# Patient Record
Sex: Male | Born: 1935 | Race: White | Hispanic: No | Marital: Married | Smoking: Never smoker
Health system: Southern US, Community
[De-identification: ages and names within clinical notes are randomized; demographics above are authoritative.]

## PROBLEM LIST (undated history)

## (undated) DIAGNOSIS — F0390 Unspecified dementia without behavioral disturbance: Secondary | ICD-10-CM

---

## 2017-07-24 DIAGNOSIS — I2510 Atherosclerotic heart disease of native coronary artery without angina pectoris: Secondary | ICD-10-CM | POA: Diagnosis not present

## 2017-07-24 DIAGNOSIS — Z79891 Long term (current) use of opiate analgesic: Secondary | ICD-10-CM | POA: Diagnosis not present

## 2017-07-24 DIAGNOSIS — M1611 Unilateral primary osteoarthritis, right hip: Secondary | ICD-10-CM | POA: Diagnosis not present

## 2017-07-24 DIAGNOSIS — Z0000 Encounter for general adult medical examination without abnormal findings: Secondary | ICD-10-CM | POA: Diagnosis not present

## 2017-07-26 DIAGNOSIS — Z0000 Encounter for general adult medical examination without abnormal findings: Secondary | ICD-10-CM | POA: Diagnosis not present

## 2017-07-26 DIAGNOSIS — Z79891 Long term (current) use of opiate analgesic: Secondary | ICD-10-CM | POA: Diagnosis not present

## 2017-07-26 DIAGNOSIS — I2510 Atherosclerotic heart disease of native coronary artery without angina pectoris: Secondary | ICD-10-CM | POA: Diagnosis not present

## 2017-07-26 DIAGNOSIS — M1611 Unilateral primary osteoarthritis, right hip: Secondary | ICD-10-CM | POA: Diagnosis not present

## 2017-08-08 DIAGNOSIS — R739 Hyperglycemia, unspecified: Secondary | ICD-10-CM | POA: Diagnosis not present

## 2017-08-08 DIAGNOSIS — D72829 Elevated white blood cell count, unspecified: Secondary | ICD-10-CM | POA: Diagnosis not present

## 2017-08-31 DIAGNOSIS — M1611 Unilateral primary osteoarthritis, right hip: Secondary | ICD-10-CM | POA: Diagnosis not present

## 2017-08-31 DIAGNOSIS — G8929 Other chronic pain: Secondary | ICD-10-CM | POA: Diagnosis not present

## 2017-08-31 DIAGNOSIS — M25551 Pain in right hip: Secondary | ICD-10-CM | POA: Diagnosis not present

## 2017-09-12 DIAGNOSIS — R112 Nausea with vomiting, unspecified: Secondary | ICD-10-CM | POA: Diagnosis not present

## 2017-09-29 DIAGNOSIS — D649 Anemia, unspecified: Secondary | ICD-10-CM | POA: Diagnosis not present

## 2017-11-08 DIAGNOSIS — M1611 Unilateral primary osteoarthritis, right hip: Secondary | ICD-10-CM | POA: Diagnosis not present

## 2017-11-08 DIAGNOSIS — I2510 Atherosclerotic heart disease of native coronary artery without angina pectoris: Secondary | ICD-10-CM | POA: Diagnosis not present

## 2017-12-28 ENCOUNTER — Other Ambulatory Visit: Payer: Self-pay | Admitting: *Deleted

## 2017-12-28 NOTE — Patient Outreach (Signed)
[facility] Care Management  12/28/2017  Victor Jones 13-Aug-1928 [id number]   HTA/HRA No needs  RN spoke with pt and confirmed identifiers as caregiver spouse requested RN to talk with her concerning today's call. Pt granted permission to speak with his spouse. RN explained the purpose for the call today on behalf of HTA and review the questionnaire and inquired further on any needs. Wife indicated pt is doing well confirming the pt's primary in [place] and verified AD in place and the only issues is pt's memory. No other needs or issues to address at this time however grateful for the call. The only CAD history reported was history of a stent. Wife informed the pt would [facility] if any needs should arise. No other inquires or request at this time. Case close   Elliot CousinLisa Meilah Delrosario, RN Care Management Coordinator [facility] Main Office [phone]

## 2018-07-26 DIAGNOSIS — I2510 Atherosclerotic heart disease of native coronary artery without angina pectoris: Secondary | ICD-10-CM | POA: Diagnosis not present

## 2018-07-26 DIAGNOSIS — M1611 Unilateral primary osteoarthritis, right hip: Secondary | ICD-10-CM | POA: Diagnosis not present

## 2018-07-26 DIAGNOSIS — Z0000 Encounter for general adult medical examination without abnormal findings: Secondary | ICD-10-CM | POA: Diagnosis not present

## 2018-07-26 DIAGNOSIS — R7303 Prediabetes: Secondary | ICD-10-CM | POA: Diagnosis not present

## 2018-07-26 DIAGNOSIS — R7302 Impaired glucose tolerance (oral): Secondary | ICD-10-CM | POA: Diagnosis not present

## 2018-07-26 DIAGNOSIS — Z79891 Long term (current) use of opiate analgesic: Secondary | ICD-10-CM | POA: Diagnosis not present

## 2018-11-14 DIAGNOSIS — E782 Mixed hyperlipidemia: Secondary | ICD-10-CM | POA: Diagnosis not present

## 2018-11-14 DIAGNOSIS — I2510 Atherosclerotic heart disease of native coronary artery without angina pectoris: Secondary | ICD-10-CM | POA: Diagnosis not present

## 2019-02-08 DIAGNOSIS — Z23 Encounter for immunization: Secondary | ICD-10-CM | POA: Diagnosis not present

## 2019-04-15 DIAGNOSIS — B351 Tinea unguium: Secondary | ICD-10-CM | POA: Diagnosis not present

## 2019-04-15 DIAGNOSIS — M79674 Pain in right toe(s): Secondary | ICD-10-CM | POA: Diagnosis not present

## 2019-04-15 DIAGNOSIS — M79675 Pain in left toe(s): Secondary | ICD-10-CM | POA: Diagnosis not present

## 2019-05-28 DIAGNOSIS — R609 Edema, unspecified: Secondary | ICD-10-CM | POA: Diagnosis not present

## 2019-05-31 DIAGNOSIS — I2510 Atherosclerotic heart disease of native coronary artery without angina pectoris: Secondary | ICD-10-CM | POA: Diagnosis not present

## 2019-05-31 DIAGNOSIS — R609 Edema, unspecified: Secondary | ICD-10-CM | POA: Diagnosis not present

## 2019-05-31 DIAGNOSIS — E782 Mixed hyperlipidemia: Secondary | ICD-10-CM | POA: Diagnosis not present

## 2019-07-06 ENCOUNTER — Emergency Department: Payer: No Typology Code available for payment source

## 2019-07-06 ENCOUNTER — Other Ambulatory Visit: Payer: Self-pay

## 2019-07-06 ENCOUNTER — Inpatient Hospital Stay
Admission: EM | Admit: 2019-07-06 | Discharge: 2019-07-10 | DRG: 689 | Disposition: A | Discharge status: Home Health | Payer: No Typology Code available for payment source | Attending: Internal Medicine | Admitting: Internal Medicine

## 2019-07-06 DIAGNOSIS — G9341 Metabolic encephalopathy: Secondary | ICD-10-CM

## 2019-07-06 DIAGNOSIS — R001 Bradycardia, unspecified: Secondary | ICD-10-CM | POA: Diagnosis not present

## 2019-07-06 DIAGNOSIS — R0902 Hypoxemia: Secondary | ICD-10-CM | POA: Diagnosis not present

## 2019-07-06 DIAGNOSIS — G459 Transient cerebral ischemic attack, unspecified: Secondary | ICD-10-CM | POA: Diagnosis not present

## 2019-07-06 DIAGNOSIS — I6523 Occlusion and stenosis of bilateral carotid arteries: Secondary | ICD-10-CM | POA: Diagnosis not present

## 2019-07-06 DIAGNOSIS — I2510 Atherosclerotic heart disease of native coronary artery without angina pectoris: Secondary | ICD-10-CM | POA: Diagnosis present

## 2019-07-06 DIAGNOSIS — S3993XA Unspecified injury of pelvis, initial encounter: Secondary | ICD-10-CM | POA: Diagnosis not present

## 2019-07-06 DIAGNOSIS — F0391 Unspecified dementia with behavioral disturbance: Secondary | ICD-10-CM

## 2019-07-06 DIAGNOSIS — M25551 Pain in right hip: Secondary | ICD-10-CM | POA: Diagnosis not present

## 2019-07-06 DIAGNOSIS — R531 Weakness: Secondary | ICD-10-CM | POA: Diagnosis not present

## 2019-07-06 DIAGNOSIS — Z79899 Other long term (current) drug therapy: Secondary | ICD-10-CM

## 2019-07-06 DIAGNOSIS — R4020 Unspecified coma: Secondary | ICD-10-CM | POA: Diagnosis not present

## 2019-07-06 DIAGNOSIS — Y92009 Unspecified place in unspecified non-institutional (private) residence as the place of occurrence of the external cause: Secondary | ICD-10-CM

## 2019-07-06 DIAGNOSIS — Z9181 History of falling: Secondary | ICD-10-CM

## 2019-07-06 DIAGNOSIS — Z7982 Long term (current) use of aspirin: Secondary | ICD-10-CM

## 2019-07-06 DIAGNOSIS — F03918 Unspecified dementia, unspecified severity, with other behavioral disturbance: Secondary | ICD-10-CM

## 2019-07-06 DIAGNOSIS — W1830XA Fall on same level, unspecified, initial encounter: Secondary | ICD-10-CM | POA: Diagnosis present

## 2019-07-06 DIAGNOSIS — R338 Other retention of urine: Secondary | ICD-10-CM

## 2019-07-06 DIAGNOSIS — N179 Acute kidney failure, unspecified: Secondary | ICD-10-CM

## 2019-07-06 DIAGNOSIS — N320 Bladder-neck obstruction: Secondary | ICD-10-CM | POA: Diagnosis present

## 2019-07-06 DIAGNOSIS — Z96642 Presence of left artificial hip joint: Secondary | ICD-10-CM | POA: Diagnosis present

## 2019-07-06 DIAGNOSIS — N390 Urinary tract infection, site not specified: Principal | ICD-10-CM | POA: Diagnosis present

## 2019-07-06 DIAGNOSIS — Z955 Presence of coronary angioplasty implant and graft: Secondary | ICD-10-CM

## 2019-07-06 DIAGNOSIS — W19XXXA Unspecified fall, initial encounter: Secondary | ICD-10-CM

## 2019-07-06 DIAGNOSIS — E876 Hypokalemia: Secondary | ICD-10-CM | POA: Diagnosis not present

## 2019-07-06 DIAGNOSIS — Z20822 Contact with and (suspected) exposure to covid-19: Secondary | ICD-10-CM | POA: Diagnosis present

## 2019-07-06 LAB — URINALYSIS, COMPLETE (UACMP) WITH MICROSCOPIC
Bilirubin Urine: NEGATIVE
Glucose, UA: NEGATIVE mg/dL
Hgb urine dipstick: NEGATIVE
Ketones, ur: NEGATIVE mg/dL
Nitrite: NEGATIVE
Protein, ur: NEGATIVE mg/dL
Specific Gravity, Urine: 1.009 (ref 1.005–1.030)
pH: 6 (ref 5.0–8.0)

## 2019-07-06 LAB — COMPREHENSIVE METABOLIC PANEL
ALT: 24 U/L (ref 0–44)
AST: 31 U/L (ref 15–41)
Albumin: 4.1 g/dL (ref 3.5–5.0)
Alkaline Phosphatase: 94 U/L (ref 38–126)
Anion gap: 8 (ref 5–15)
BUN: 23 mg/dL (ref 8–23)
CO2: 28 mmol/L (ref 22–32)
Calcium: 9 mg/dL (ref 8.9–10.3)
Chloride: 100 mmol/L (ref 98–111)
Creatinine, Ser: 1.3 mg/dL — ABNORMAL HIGH (ref 0.61–1.24)
GFR calc Af Amer: 56 mL/min — ABNORMAL LOW (ref >60)
GFR calc non Af Amer: 49 mL/min — ABNORMAL LOW (ref >60)
Glucose, Bld: 126 mg/dL — ABNORMAL HIGH (ref 70–99)
Potassium: 3.7 mmol/L (ref 3.5–5.1)
Sodium: 136 mmol/L (ref 135–145)
Total Bilirubin: 1.6 mg/dL — ABNORMAL HIGH (ref 0.3–1.2)
Total Protein: 6.8 g/dL (ref 6.5–8.1)

## 2019-07-06 LAB — CBC
HCT: 35.7 % — ABNORMAL LOW (ref 39.0–52.0)
Hemoglobin: 12.1 g/dL — ABNORMAL LOW (ref 13.0–17.0)
MCH: 31.8 pg (ref 26.0–34.0)
MCHC: 33.9 g/dL (ref 30.0–36.0)
MCV: 93.7 fL (ref 80.0–100.0)
Platelets: 213 10*3/uL (ref 150–400)
RBC: 3.81 MIL/uL — ABNORMAL LOW (ref 4.22–5.81)
RDW: 13.3 % (ref 11.5–15.5)
WBC: 10.7 10*3/uL — ABNORMAL HIGH (ref 4.0–10.5)
nRBC: 0 % (ref 0.0–0.2)

## 2019-07-06 LAB — TROPONIN I (HIGH SENSITIVITY)
Troponin I (High Sensitivity): 6 ng/L (ref <18)
Troponin I (High Sensitivity): 8 ng/L (ref <18)

## 2019-07-06 LAB — BRAIN NATRIURETIC PEPTIDE: B Natriuretic Peptide: 60 pg/mL (ref 0.0–100.0)

## 2019-07-06 MED ORDER — HALOPERIDOL LACTATE 5 MG/ML IJ SOLN
2.0000 mg | Freq: Once | INTRAMUSCULAR | Status: AC
  Administered 2019-07-06: 2 mg via INTRAVENOUS
  Filled 2019-07-06: qty 1

## 2019-07-06 MED ORDER — LORAZEPAM 2 MG/ML IJ SOLN
1.0000 mg | Freq: Once | INTRAMUSCULAR | Status: AC
  Administered 2019-07-06: 1 mg via INTRAVENOUS
  Filled 2019-07-06: qty 1

## 2019-07-06 MED ORDER — SODIUM CHLORIDE 0.9 % IV SOLN
1.0000 g | Freq: Once | INTRAVENOUS | Status: AC
  Administered 2019-07-06: 1 g via INTRAVENOUS
  Filled 2019-07-06: qty 10

## 2019-07-06 NOTE — ED Notes (Signed)
Spouse at bedside

## 2019-07-06 NOTE — ED Notes (Signed)
Pt calm, sleeping.

## 2019-07-06 NOTE — ED Notes (Addendum)
Pt in ct. Pt not calm enough for ct.

## 2019-07-06 NOTE — ED Provider Notes (Signed)
[place] EMERGENCY DEPARTMENT Provider Note   CSN: [id number] Arrival date & time: 07/06/19  1857     History Chief Complaint  Patient presents with  . Altered Mental Status    Eryk Beavers is a 86 y.o. male history of dementia, CAD with stents, here presenting with fall and weakness. Patient was at home and apparently had a mechanical fall.  Per the wife she was not sure if he had a head injury at that time.  She states that he had a hard time getting up at that time.  She called EMS they were able to get him up and he sat down on the recliner.  She states that he sat there for several hours and still unable to get up.  He is also more confused than usual.  And she is also concerned that he may have a UTI.  He is demented at baseline and unable to give any history.  The history is provided by a relative and the EMS personnel.  Level V caveat- dementia      No past medical history on file.  There are no problems to display for this patient.      No family history on file.  Social History   Tobacco Use  . Smoking status: Not on file  Substance Use Topics  . Alcohol use: Not on file  . Drug use: Not on file    Home Medications Prior to Admission medications   Not on File    Allergies    Patient has no known allergies.  Review of Systems   Review of Systems  Unable to perform ROS: Dementia  All other systems reviewed and are negative.   Physical Exam Updated Vital Signs BP 133/69 (BP Location: Left Arm)   Pulse (!) 102   Temp 98 F (36.7 C) (Oral)   Resp 18   Ht 5\' 11"  (1.803 m)   Wt 60.2 kg   SpO2 99%   BMI 18.51 kg/m   Physical Exam Vitals and nursing note reviewed.  Constitutional:      Comments: Demented   HENT:     Head: Normocephalic.     Mouth/Throat:     Mouth: Mucous membranes are moist.  Eyes:     Extraocular Movements: Extraocular movements intact.     Pupils: Pupils are equal, round, and reactive to light.    Cardiovascular:     Rate and Rhythm: Normal rate and regular rhythm.     Pulses: Normal pulses.     Heart sounds: Normal heart sounds.  Pulmonary:     Effort: Pulmonary effort is normal.     Breath sounds: Normal breath sounds.  Abdominal:     General: Abdomen is flat.  Musculoskeletal:     Cervical back: Normal range of motion.     Comments: Pelvis stable, no spinal tenderness, nl ROM bilateral hips, no obvious extremity trauma   Skin:    Capillary Refill: Capillary refill takes less than 2 seconds.  Neurological:     Comments: Demented, contracted, moving all extremities   Psychiatric:     Comments: Unable      ED Results / Procedures / Treatments   Labs (all labs ordered are listed, but only abnormal results are displayed) Labs Reviewed  CBC - Abnormal; Notable for the following components:      Result Value   WBC 10.7 (*)    RBC 3.81 (*)    Hemoglobin 12.1 (*)  HCT 35.7 (*)    All other components within normal limits  URINE CULTURE  COMPREHENSIVE METABOLIC PANEL  URINALYSIS, COMPLETE (UACMP) WITH MICROSCOPIC  BRAIN NATRIURETIC PEPTIDE  TROPONIN I (HIGH SENSITIVITY)    EKG EKG Interpretation  Date/Time:  Saturday July 06 2019 19:09:23 EST Ventricular Rate:  102 PR Interval:    QRS Duration: 95 QT Interval:  350 QTC Calculation: 456 R Axis:   70 Text Interpretation: Sinus tachycardia Multiform ventricular premature complexes Baseline wander in lead(s) I aVR V5 No previous ECGs available Confirmed by Richardean Canal [phone]) on 07/06/2019 7:12:30 PM   Radiology No results found.  Procedures Procedures (including critical care time)  Medications Ordered in ED Medications - No data to display  ED Course  I have reviewed the triage vital signs and the nursing notes.  Pertinent labs & imaging results that were available during my care of the patient were reviewed by me and considered in my medical decision making (see chart for details).    MDM  Rules/Calculators/A&P                      Rayshaun Needle is a 86 y.o. male here presenting with altered mental status, fall.  Patient was unable to get up after his fall.  He is demented at baseline and apparently is more altered than usual per the wife.  Given possible trauma, will get CT head and neck and x-rays.  We will also get labs and urinalysis as well.    11 PM  UA + UTI. CT head/neck/pelvis pending. Since he is too weak to walk, anticipate admission for UTI if CT unremarkable. Signed out to Dr. Fuller Plan in the ED.    Final Clinical Impression(s) / ED Diagnoses Final diagnoses:  None    Rx / DC Orders ED Discharge Orders    None       Charlynne Pander, MD 07/06/19 2332

## 2019-07-06 NOTE — ED Triage Notes (Addendum)
Per ems pt with possible uti and possible ams. Per with history of dementia, alert to self only at this time. Pt denies pain. Pt does not follow commands, moving all extremities without difficulty. Skin tear noted to left elbow.

## 2019-07-06 NOTE — ED Notes (Signed)
Per karen in ct pt not calm enough for ct scan. md notified.

## 2019-07-07 DIAGNOSIS — W1830XA Fall on same level, unspecified, initial encounter: Secondary | ICD-10-CM | POA: Diagnosis present

## 2019-07-07 DIAGNOSIS — R338 Other retention of urine: Secondary | ICD-10-CM

## 2019-07-07 DIAGNOSIS — E876 Hypokalemia: Secondary | ICD-10-CM | POA: Diagnosis not present

## 2019-07-07 DIAGNOSIS — W19XXXA Unspecified fall, initial encounter: Secondary | ICD-10-CM

## 2019-07-07 DIAGNOSIS — N179 Acute kidney failure, unspecified: Secondary | ICD-10-CM

## 2019-07-07 DIAGNOSIS — Z7982 Long term (current) use of aspirin: Secondary | ICD-10-CM | POA: Diagnosis not present

## 2019-07-07 DIAGNOSIS — Z9181 History of falling: Secondary | ICD-10-CM | POA: Diagnosis not present

## 2019-07-07 DIAGNOSIS — G9341 Metabolic encephalopathy: Secondary | ICD-10-CM

## 2019-07-07 DIAGNOSIS — F03918 Unspecified dementia, unspecified severity, with other behavioral disturbance: Secondary | ICD-10-CM

## 2019-07-07 DIAGNOSIS — I2510 Atherosclerotic heart disease of native coronary artery without angina pectoris: Secondary | ICD-10-CM

## 2019-07-07 DIAGNOSIS — Z79899 Other long term (current) drug therapy: Secondary | ICD-10-CM | POA: Diagnosis not present

## 2019-07-07 DIAGNOSIS — Z96642 Presence of left artificial hip joint: Secondary | ICD-10-CM | POA: Diagnosis present

## 2019-07-07 DIAGNOSIS — F0391 Unspecified dementia with behavioral disturbance: Secondary | ICD-10-CM

## 2019-07-07 DIAGNOSIS — Z20822 Contact with and (suspected) exposure to covid-19: Secondary | ICD-10-CM | POA: Diagnosis present

## 2019-07-07 DIAGNOSIS — N320 Bladder-neck obstruction: Secondary | ICD-10-CM | POA: Diagnosis present

## 2019-07-07 DIAGNOSIS — N390 Urinary tract infection, site not specified: Secondary | ICD-10-CM

## 2019-07-07 DIAGNOSIS — Z955 Presence of coronary angioplasty implant and graft: Secondary | ICD-10-CM | POA: Diagnosis not present

## 2019-07-07 DIAGNOSIS — Y92009 Unspecified place in unspecified non-institutional (private) residence as the place of occurrence of the external cause: Secondary | ICD-10-CM

## 2019-07-07 LAB — BASIC METABOLIC PANEL
Anion gap: 10 (ref 5–15)
BUN: 20 mg/dL (ref 8–23)
CO2: 27 mmol/L (ref 22–32)
Calcium: 8.7 mg/dL — ABNORMAL LOW (ref 8.9–10.3)
Chloride: 104 mmol/L (ref 98–111)
Creatinine, Ser: 1.25 mg/dL — ABNORMAL HIGH (ref 0.61–1.24)
GFR calc Af Amer: 59 mL/min — ABNORMAL LOW (ref >60)
GFR calc non Af Amer: 51 mL/min — ABNORMAL LOW (ref >60)
Glucose, Bld: 107 mg/dL — ABNORMAL HIGH (ref 70–99)
Potassium: 3.8 mmol/L (ref 3.5–5.1)
Sodium: 141 mmol/L (ref 135–145)

## 2019-07-07 LAB — CBC
HCT: 34.5 % — ABNORMAL LOW (ref 39.0–52.0)
Hemoglobin: 11.6 g/dL — ABNORMAL LOW (ref 13.0–17.0)
MCH: 31.6 pg (ref 26.0–34.0)
MCHC: 33.6 g/dL (ref 30.0–36.0)
MCV: 94 fL (ref 80.0–100.0)
Platelets: 186 10*3/uL (ref 150–400)
RBC: 3.67 MIL/uL — ABNORMAL LOW (ref 4.22–5.81)
RDW: 13.2 % (ref 11.5–15.5)
WBC: 11 10*3/uL — ABNORMAL HIGH (ref 4.0–10.5)
nRBC: 0 % (ref 0.0–0.2)

## 2019-07-07 LAB — SARS CORONAVIRUS 2 (TAT 6-24 HRS): SARS Coronavirus 2: NEGATIVE

## 2019-07-07 MED ORDER — ACETAMINOPHEN 650 MG RE SUPP: 650.0000 mg | Freq: Four times a day (QID) | RECTAL | Status: DC | PRN

## 2019-07-07 MED ORDER — SENNOSIDES-DOCUSATE SODIUM 8.6-50 MG PO TABS: 1.0000 | ORAL_TABLET | Freq: Every evening | ORAL | Status: DC | PRN

## 2019-07-07 MED ORDER — SODIUM CHLORIDE 0.9 % IV SOLN
1.0000 g | INTRAVENOUS | Status: DC
  Administered 2019-07-07 – 2019-07-08 (×2): 1 g via INTRAVENOUS
  Filled 2019-07-07: qty 1
  Filled 2019-07-07: qty 10

## 2019-07-07 MED ORDER — SODIUM CHLORIDE 0.9 % IV SOLN: INTRAVENOUS | Status: DC

## 2019-07-07 MED ORDER — SIMVASTATIN 20 MG PO TABS
20.0000 mg | ORAL_TABLET | Freq: Every day | ORAL | Status: DC
  Administered 2019-07-07 – 2019-07-09 (×3): 20 mg via ORAL
  Filled 2019-07-07 (×3): qty 1

## 2019-07-07 MED ORDER — ENOXAPARIN SODIUM 40 MG/0.4ML ~~LOC~~ SOLN
40.0000 mg | SUBCUTANEOUS | Status: DC
  Administered 2019-07-07 – 2019-07-10 (×4): 40 mg via SUBCUTANEOUS
  Filled 2019-07-07 (×4): qty 0.4

## 2019-07-07 MED ORDER — CHLORHEXIDINE GLUCONATE CLOTH 2 % EX PADS
6.0000 | MEDICATED_PAD | Freq: Every day | CUTANEOUS | Status: DC
  Administered 2019-07-07 – 2019-07-09 (×3): 6 via TOPICAL

## 2019-07-07 MED ORDER — ONDANSETRON HCL 4 MG/2ML IJ SOLN: 4.0000 mg | Freq: Four times a day (QID) | INTRAMUSCULAR | Status: DC | PRN

## 2019-07-07 MED ORDER — ONDANSETRON HCL 4 MG PO TABS: 4.0000 mg | ORAL_TABLET | Freq: Four times a day (QID) | ORAL | Status: DC | PRN

## 2019-07-07 MED ORDER — ACETAMINOPHEN 325 MG PO TABS: 650.0000 mg | ORAL_TABLET | Freq: Four times a day (QID) | ORAL | Status: DC | PRN

## 2019-07-07 MED ORDER — SODIUM CHLORIDE 0.9 % IV SOLN: 1.0000 g | INTRAVENOUS | Status: DC

## 2019-07-07 MED ORDER — ASPIRIN 81 MG PO CHEW
81.0000 mg | CHEWABLE_TABLET | Freq: Every day | ORAL | Status: DC
  Administered 2019-07-07 – 2019-07-10 (×4): 81 mg via ORAL
  Filled 2019-07-07 (×4): qty 1

## 2019-07-07 NOTE — Plan of Care (Signed)
  Problem: Pain Managment: Goal: General experience of comfort will improve Outcome: Completed/Met

## 2019-07-07 NOTE — H&P (Signed)
History and Physical    Reuven Braver SWF:[id number] DOB: December 16, 1928 DOA: 07/06/2019  PCP: Marina Goodell, MD   Patient coming from: home  I have personally briefly reviewed patient's old medical records in [facility]  Chief Complaint: fall, confusion  HPI: Victor Jones is a 86 y.o. male with medical history significant for dementia, coronary artery disease with history of stent angioplasty, followed by Dr. Lady Gary as well as history of chronic lower extremity edema, who was brought into the emergency room following a fall.  Patient has dementia so most of history is taken from ER records History from wife at bedside.  Patient apparently fell while he was trying to get into the recliner and was unable to get up.  EMS was able to get patient up to chair he remained for several hours and appeared more confused than usual and he was thus brought back to the emergency room.  No reports of vomiting or diarrhea and no prior reports of pain complaints  ED Course: On arrival to the emergency room he was afebrile with BP 133/69 HR 102 O2 sat 99% on room air.  Blood work significant for white cell count of 10,700, creatinine of 1.3, up from baseline of 0.9 at Dr. America Brown office in January 2021.  Analysis showed large leukocyte esterase consistent with UTI.  Underwent extensive trauma imaging including head and C-spine CT which both showed no acute findings.  Hip x-ray showed no fracture and showed a prior stable left hip arthroplasty.  CT abdomen and pelvis did show severely distended urinary bladder concerning for bladder outlet obstruction.  While in the emergency room patient's was treated for agitation with Haldol and lorazepam.  He was also started on IV Rocephin.  TRH consulted for admission.  Review of Systems: Patient unable to participate in review of systems due to dementia.  He answers yes to all questions asked  No past medical history on file.  The histories are not reviewed yet. Please  review them in the "History" navigator section and refresh this SmartLink.   has no history on file for tobacco, alcohol, and drug.  No Known Allergies  No family history on file.   Prior to Admission medications   Medication Sig Start Date End Date Taking? Authorizing Provider  aspirin EC 81 MG tablet Take 81 mg by mouth daily.   Yes [provider]  simvastatin (ZOCOR) 20 MG tablet Take 20 mg by mouth at bedtime. 04/30/19  Yes [provider]  torsemide (DEMADEX) 10 MG tablet Take 10 mg by mouth daily. 06/05/19  Yes [provider]    Physical Exam: Vitals:   07/06/19 2100 07/06/19 2130 07/06/19 2200 07/06/19 2330  BP:  107/67 113/71   Pulse: 87 85 77 88  Resp: (!) 21 (!) 22 20   Temp:      TempSrc:      SpO2: 95% 96% 96% 97%  Weight:      Height:         Vitals:   07/06/19 2100 07/06/19 2130 07/06/19 2200 07/06/19 2330  BP:  107/67 113/71   Pulse: 87 85 77 88  Resp: (!) 21 (!) 22 20   Temp:      TempSrc:      SpO2: 95% 96% 96% 97%  Weight:      Height:        Constitutional: NAD, difficult to assess orientation.  Answers inappropriately, yes to everything. Eyes: PERRL, lids and conjunctivae normal ENMT:  Mucous membranes are moist.  Neck: normal, supple, no masses, no thyromegaly Respiratory: clear to auscultation bilaterally, no wheezing, no crackles. Normal respiratory effort. No accessory muscle use.  Cardiovascular: Regular rate and rhythm, no murmurs / rubs / gallops. No extremity edema. 2+ pedal pulses. No carotid bruits.  Abdomen: no tenderness, no masses palpated. No hepatosplenomegaly. Bowel sounds positive.  Musculoskeletal: no clubbing / cyanosis. No joint deformity upper and lower extremities.  Skin: no rashes, lesions, ulcers.  Neurologic: No gross focal neurologic deficit. Psychiatric: difficult to assess but appears calm  Labs on Admission: I have personally reviewed following labs and imaging studies  CBC: Recent  Labs  Lab 07/06/19 1911  WBC 10.7*  HGB 12.1*  HCT 35.7*  MCV 93.7  PLT 630   Basic Metabolic Panel: Recent Labs  Lab 07/06/19 1911  NA 136  K 3.7  CL 100  CO2 28  GLUCOSE 126*  BUN 23  CREATININE 1.30*  CALCIUM 9.0   GFR: Estimated Creatinine Clearance: 33.4 mL/min (A) (by C-G formula based on SCr of 1.3 mg/dL (H)). Liver Function Tests: Recent Labs  Lab 07/06/19 1911  AST 31  ALT 24  ALKPHOS 94  BILITOT 1.6*  PROT 6.8  ALBUMIN 4.1   No results for input(s): LIPASE, AMYLASE in the last 168 hours. No results for input(s): AMMONIA in the last 168 hours. Coagulation Profile: No results for input(s): INR, PROTIME in the last 168 hours. Cardiac Enzymes: No results for input(s): CKTOTAL, CKMB, CKMBINDEX, TROPONINI in the last 168 hours. BNP (last 3 results) No results for input(s): PROBNP in the last 8760 hours. HbA1C: No results for input(s): HGBA1C in the last 72 hours. CBG: No results for input(s): GLUCAP in the last 168 hours. Lipid Profile: No results for input(s): CHOL, HDL, LDLCALC, TRIG, CHOLHDL, LDLDIRECT in the last 72 hours. Thyroid Function Tests: No results for input(s): TSH, T4TOTAL, FREET4, T3FREE, THYROIDAB in the last 72 hours. Anemia Panel: No results for input(s): VITAMINB12, FOLATE, FERRITIN, TIBC, IRON, RETICCTPCT in the last 72 hours. Urine analysis:    Component Value Date/Time   COLORURINE YELLOW (A) 07/06/2019 2120   APPEARANCEUR HAZY (A) 07/06/2019 2120   LABSPEC 1.009 07/06/2019 2120   PHURINE 6.0 07/06/2019 2120   GLUCOSEU NEGATIVE 07/06/2019 2120   HGBUR NEGATIVE 07/06/2019 2120   [place] NEGATIVE 07/06/2019 2120   KETONESUR NEGATIVE 07/06/2019 2120   PROTEINUR NEGATIVE 07/06/2019 2120   NITRITE NEGATIVE 07/06/2019 2120   LEUKOCYTESUR MODERATE (A) 07/06/2019 2120    Radiological Exams on Admission: DG Chest 1 View  Result Date: 07/06/2019 CLINICAL DATA:  Altered level of consciousness, confusion, possible urinary  tract infection EXAM: CHEST  1 VIEW COMPARISON:  None. FINDINGS: Single frontal view of the chest demonstrates an unremarkable cardiac silhouette. The thoracic aorta is ectatic. Bibasilar interstitial prominence is noted, without superimposed airspace disease, effusion, or pneumothorax. No acute bony abnormalities. IMPRESSION: 1. Bibasilar interstitial prominence, differential includes interstitial edema versus chronic scarring. 2. No acute airspace disease. Electronically Signed   By: Randa Ngo M.D.   On: 07/06/2019 19:42   DG Pelvis 1-2 Views  Result Date: 07/06/2019 CLINICAL DATA:  Altered level of consciousness, fell, urinary tract infection EXAM: PELVIS - 1-2 VIEW COMPARISON:  None. FINDINGS: There are 2 frontal views of the pelvis. There is severe right hip osteoarthritis with complete loss of joint space, bony remodeling of the femoral head and acetabulum, subchondral cyst formation, and marked osteophyte formation. Left hip arthroplasty is identified. Lucency surrounding the  acetabular prosthesis could be sequela of particle disease. Please correlate with any previous hip prosthesis revision. The current prosthesis appears well aligned. I do not see any acute displaced fractures. Prominent spondylosis at the lumbosacral junction. IMPRESSION: 1. No acute bony abnormality. 2. Severe end-stage right hip osteoarthritis. 3. Left hip arthroplasty as above. Electronically Signed   By: Sharlet Salina M.D.   On: 07/06/2019 19:43   CT Head Wo Contrast  Result Date: 07/06/2019 CLINICAL DATA:  TIA. Altered mental status. Dementia. Patient uncooperative. EXAM: CT HEAD WITHOUT CONTRAST CT CERVICAL SPINE WITHOUT CONTRAST TECHNIQUE: Multidetector CT imaging of the head and cervical spine was performed following the standard protocol without intravenous contrast. Multiplanar CT image reconstructions of the cervical spine were also generated. COMPARISON:  None. FINDINGS: CT HEAD FINDINGS Brain: Study suffers from  motion degradation. There is generalized brain atrophy. No sign of acute infarction, mass lesion, hemorrhage, hydrocephalus or extra-axial collection. Vascular: There is atherosclerotic calcification of the major vessels at the base of the brain. Skull: Negative Sinuses/Orbits: Clear/normal Other: None CT CERVICAL SPINE FINDINGS Motion degradation on this exam as well. Alignment: No traumatic malalignment. Skull base and vertebrae: No fracture or focal lesion. Soft tissues and spinal canal: Negative Disc levels: Chronic facet osteoarthritis at C2-3, C3-4, C4-5 and C7-T1. Chronic degenerative spondylosis with disc space narrowing throughout the cervical region. No apparent compressive canal stenosis. Some osteophytic foraminal narrowing from C3-4 through C7-T1, most pronounced at the C3-4 and C4-5 levels. Upper chest: Pleural and parenchymal scarring. Other: None IMPRESSION: Study suffer from motion degradation. This limits sensitivity for subtle findings. Head CT: Generalized atrophy.  No sign of focal or acute finding. Cervical spine CT: No acute or traumatic finding. Chronic degenerative spondylosis and facet arthropathy. Foraminal narrowing most pronounced at C3-4 and C4-5. Electronically Signed   By: Paulina Fusi M.D.   On: 07/06/2019 23:17   CT Cervical Spine Wo Contrast  Result Date: 07/06/2019 CLINICAL DATA:  TIA. Altered mental status. Dementia. Patient uncooperative. EXAM: CT HEAD WITHOUT CONTRAST CT CERVICAL SPINE WITHOUT CONTRAST TECHNIQUE: Multidetector CT imaging of the head and cervical spine was performed following the standard protocol without intravenous contrast. Multiplanar CT image reconstructions of the cervical spine were also generated. COMPARISON:  None. FINDINGS: CT HEAD FINDINGS Brain: Study suffers from motion degradation. There is generalized brain atrophy. No sign of acute infarction, mass lesion, hemorrhage, hydrocephalus or extra-axial collection. Vascular: There is atherosclerotic  calcification of the major vessels at the base of the brain. Skull: Negative Sinuses/Orbits: Clear/normal Other: None CT CERVICAL SPINE FINDINGS Motion degradation on this exam as well. Alignment: No traumatic malalignment. Skull base and vertebrae: No fracture or focal lesion. Soft tissues and spinal canal: Negative Disc levels: Chronic facet osteoarthritis at C2-3, C3-4, C4-5 and C7-T1. Chronic degenerative spondylosis with disc space narrowing throughout the cervical region. No apparent compressive canal stenosis. Some osteophytic foraminal narrowing from C3-4 through C7-T1, most pronounced at the C3-4 and C4-5 levels. Upper chest: Pleural and parenchymal scarring. Other: None IMPRESSION: Study suffer from motion degradation. This limits sensitivity for subtle findings. Head CT: Generalized atrophy.  No sign of focal or acute finding. Cervical spine CT: No acute or traumatic finding. Chronic degenerative spondylosis and facet arthropathy. Foraminal narrowing most pronounced at C3-4 and C4-5. Electronically Signed   By: Paulina Fusi M.D.   On: 07/06/2019 23:17   CT PELVIS WO CONTRAST  Result Date: 07/06/2019 CLINICAL DATA:  86 year old male with fall and hip pain. EXAM: CT PELVIS WITHOUT CONTRAST  TECHNIQUE: Multidetector CT imaging of the pelvis was performed following the standard protocol without intravenous contrast. COMPARISON:  Pelvic radiograph dated 07/06/2019. FINDINGS: Evaluation is limited due to streak artifact caused by metallic left hip arthroplasty. Urinary Tract: Severely distended urinary bladder concerning for bladder outlet obstruction. Clinical correlation is recommended. Consider placement of the Foley catheter if the patient is unable to void. Bowel:  No bowel dilatation in the visualized pelvis. Vascular/Lymphatic: Advanced aortoiliac atherosclerotic disease. No adenopathy noted within the pelvis. Reproductive: Mildly enlarged prostate gland measuring 4.7 cm in transverse axial diameter.  Apparent post procedural changes of TURP. Other:  None Musculoskeletal: Evaluation for fracture is limited due to advanced osteopenia. No acute fracture or dislocation. Severe arthritic changes of the right hip with near complete joint space loss and bone-on-bone contact. IMPRESSION: 1. No acute fracture or dislocation. 2. Osteopenia and severe osteoarthritic changes of the right hip. 3. Severely distended urinary bladder concerning for bladder outlet obstruction. Clinical correlation is recommended. 4. Aortic Atherosclerosis (ICD10-I70.0). Electronically Signed   By: Elgie Collard M.D.   On: 07/06/2019 23:37    EKG: Independently reviewed.   Assessment/Plan Principal Problem:   Urinary tract infection   Acute urinary retention   Acute metabolic encephalopathy   AKI (acute kidney injury) (HCC)   Fall at home, initial encounter -Patient's fall likely has to do with his acute metabolic encephalopathy and acute kidney injury resulting from UTI and acute bladder retention -Foley catheter with intake and output monitoring and consider urology consult -IV hydration with monitoring of kidney function -IV Rocephin to treat UTI -Fall and aspiration precautions -Consider physical therapy evaluation    Dementia with behavioral disturbance (HCC) -Haldol as needed agitation  CAD (coronary artery disease) -Troponin was 8, EKG nonacute -Continue home aspirin and simvastatin pending med reconciliation -Patient follows with Dr. Lady Gary, last seen January 2020    DVT prophylaxis: lovenox  Code Status: full code  Family Communication: none  Disposition Plan: Back to previous home environment Consults called: none     Andris Baumann MD [facility]     07/07/2019, 12:19 AM

## 2019-07-07 NOTE — Progress Notes (Signed)
PROGRESS NOTE    Victor Jones  IRW:[id number] DOB: 06/09/1928 DOA: 07/06/2019 PCP: Sofie Hartigan, MD       Assessment & Plan:   Principal Problem:   Urinary tract infection Active Problems:   Acute urinary retention   Fall at home, initial encounter   Acute metabolic encephalopathy   Dementia with behavioral disturbance ([place])   CAD (coronary artery disease)   AKI (acute kidney injury) ([place])   UTI (urinary tract infection)   UTI: UA shows rare bacteria,mod leukocytes, urine cx is pending. Continue on IV rocephin. Continue on IVFs.   Acute urinary retention: foley placed. Will continue to monitor  Acute metabolic encephalopathy: likely secondary to above. Management as stated above  Leukocytosis: likely secondary to infection. Continue on IV abxs.   AKI: baseline Cr/GFR is unknown. Cr is trending down today. Will continue to monitor   Fall: at home. PT/OT consulted  Dementia: haldol prn for agitation  CAD: continue on home dose of aspirin, simvastain   DVT prophylaxis: lovenox  Code Status: full Family Communication: called pt's wife, Jeannett Senior, but no answer so I left a message Disposition Plan:.likely several days more as the pt clinically improves    Consultants:   n/a   Procedures:    Antimicrobials: rocephin    Subjective: Pt is oriented to person only. Pt is unable to answer any questions appropriately  Objective: Vitals:   07/06/19 2200 07/06/19 2330 07/07/19 0000 07/07/19 0127  BP: 113/71   136/69  Pulse: 77 88 88 93  Resp: 20  20 18   Temp:    98.2 F (36.8 C)  TempSrc:    Oral  SpO2: 96% 97% 97% 99%  Weight:      Height:        Intake/Output Summary (Last 24 hours) at 07/07/2019 0729 Last data filed at 07/07/2019 0042 Gross per 24 hour  Intake 100 ml  Output 3400 ml  Net -3300 ml   Filed Weights   07/06/19 1908  Weight: 60.2 kg    Examination:  General exam: Appears calm and comfortable  Respiratory system: Clear to  auscultation. No rales, rhonchi. Cardiovascular system: S1 & S2 +. No rubs, gallops or clicks. Gastrointestinal system: Abdomen is nondistended, soft and nontender.  Normal bowel sounds heard. Central nervous system: Alert and oriented to person only.  Psychiatry: Judgement and insight appear abnormal. Flat mood and affect    Data Reviewed: I have personally reviewed following labs and imaging studies  CBC: Recent Labs  Lab 07/06/19 1911 07/07/19 0539  WBC 10.7* 11.0*  HGB 12.1* 11.6*  HCT 35.7* 34.5*  MCV 93.7 94.0  PLT 213 761   Basic Metabolic Panel: Recent Labs  Lab 07/06/19 1911 07/07/19 0539  NA 136 141  K 3.7 3.8  CL 100 104  CO2 28 27  GLUCOSE 126* 107*  BUN 23 20  CREATININE 1.30* 1.25*  CALCIUM 9.0 8.7*   GFR: Estimated Creatinine Clearance: 34.8 mL/min (A) (by C-G formula based on SCr of 1.25 mg/dL (H)). Liver Function Tests: Recent Labs  Lab 07/06/19 1911  AST 31  ALT 24  ALKPHOS 94  BILITOT 1.6*  PROT 6.8  ALBUMIN 4.1   No results for input(s): LIPASE, AMYLASE in the last 168 hours. No results for input(s): AMMONIA in the last 168 hours. Coagulation Profile: No results for input(s): INR, PROTIME in the last 168 hours. Cardiac Enzymes: No results for input(s): CKTOTAL, CKMB, CKMBINDEX, TROPONINI in the last 168 hours. BNP (last 3  results) No results for input(s): PROBNP in the last 8760 hours. HbA1C: No results for input(s): HGBA1C in the last 72 hours. CBG: No results for input(s): GLUCAP in the last 168 hours. Lipid Profile: No results for input(s): CHOL, HDL, LDLCALC, TRIG, CHOLHDL, LDLDIRECT in the last 72 hours. Thyroid Function Tests: No results for input(s): TSH, T4TOTAL, FREET4, T3FREE, THYROIDAB in the last 72 hours. Anemia Panel: No results for input(s): VITAMINB12, FOLATE, FERRITIN, TIBC, IRON, RETICCTPCT in the last 72 hours. Sepsis Labs: No results for input(s): PROCALCITON, LATICACIDVEN in the last 168 hours.  No results  found for this or any previous visit (from the past 240 hour(s)).       Radiology Studies: DG Chest 1 View  Result Date: 07/06/2019 CLINICAL DATA:  Altered level of consciousness, confusion, possible urinary tract infection EXAM: CHEST  1 VIEW COMPARISON:  None. FINDINGS: Single frontal view of the chest demonstrates an unremarkable cardiac silhouette. The thoracic aorta is ectatic. Bibasilar interstitial prominence is noted, without superimposed airspace disease, effusion, or pneumothorax. No acute bony abnormalities. IMPRESSION: 1. Bibasilar interstitial prominence, differential includes interstitial edema versus chronic scarring. 2. No acute airspace disease. Electronically Signed   By: Sharlet Salina M.D.   On: 07/06/2019 19:42   DG Pelvis 1-2 Views  Result Date: 07/06/2019 CLINICAL DATA:  Altered level of consciousness, fell, urinary tract infection EXAM: PELVIS - 1-2 VIEW COMPARISON:  None. FINDINGS: There are 2 frontal views of the pelvis. There is severe right hip osteoarthritis with complete loss of joint space, bony remodeling of the femoral head and acetabulum, subchondral cyst formation, and marked osteophyte formation. Left hip arthroplasty is identified. Lucency surrounding the acetabular prosthesis could be sequela of particle disease. Please correlate with any previous hip prosthesis revision. The current prosthesis appears well aligned. I do not see any acute displaced fractures. Prominent spondylosis at the lumbosacral junction. IMPRESSION: 1. No acute bony abnormality. 2. Severe end-stage right hip osteoarthritis. 3. Left hip arthroplasty as above. Electronically Signed   By: Sharlet Salina M.D.   On: 07/06/2019 19:43   CT Head Wo Contrast  Result Date: 07/06/2019 CLINICAL DATA:  TIA. Altered mental status. Dementia. Patient uncooperative. EXAM: CT HEAD WITHOUT CONTRAST CT CERVICAL SPINE WITHOUT CONTRAST TECHNIQUE: Multidetector CT imaging of the head and cervical spine was  performed following the standard protocol without intravenous contrast. Multiplanar CT image reconstructions of the cervical spine were also generated. COMPARISON:  None. FINDINGS: CT HEAD FINDINGS Brain: Study suffers from motion degradation. There is generalized brain atrophy. No sign of acute infarction, mass lesion, hemorrhage, hydrocephalus or extra-axial collection. Vascular: There is atherosclerotic calcification of the major vessels at the base of the brain. Skull: Negative Sinuses/Orbits: Clear/normal Other: None CT CERVICAL SPINE FINDINGS Motion degradation on this exam as well. Alignment: No traumatic malalignment. Skull base and vertebrae: No fracture or focal lesion. Soft tissues and spinal canal: Negative Disc levels: Chronic facet osteoarthritis at C2-3, C3-4, C4-5 and C7-T1. Chronic degenerative spondylosis with disc space narrowing throughout the cervical region. No apparent compressive canal stenosis. Some osteophytic foraminal narrowing from C3-4 through C7-T1, most pronounced at the C3-4 and C4-5 levels. Upper chest: Pleural and parenchymal scarring. Other: None IMPRESSION: Study suffer from motion degradation. This limits sensitivity for subtle findings. Head CT: Generalized atrophy.  No sign of focal or acute finding. Cervical spine CT: No acute or traumatic finding. Chronic degenerative spondylosis and facet arthropathy. Foraminal narrowing most pronounced at C3-4 and C4-5. Electronically Signed   By: Loraine Leriche  Shogry M.D.   On: 07/06/2019 23:17   CT Cervical Spine Wo Contrast  Result Date: 07/06/2019 CLINICAL DATA:  TIA. Altered mental status. Dementia. Patient uncooperative. EXAM: CT HEAD WITHOUT CONTRAST CT CERVICAL SPINE WITHOUT CONTRAST TECHNIQUE: Multidetector CT imaging of the head and cervical spine was performed following the standard protocol without intravenous contrast. Multiplanar CT image reconstructions of the cervical spine were also generated. COMPARISON:  None. FINDINGS: CT  HEAD FINDINGS Brain: Study suffers from motion degradation. There is generalized brain atrophy. No sign of acute infarction, mass lesion, hemorrhage, hydrocephalus or extra-axial collection. Vascular: There is atherosclerotic calcification of the major vessels at the base of the brain. Skull: Negative Sinuses/Orbits: Clear/normal Other: None CT CERVICAL SPINE FINDINGS Motion degradation on this exam as well. Alignment: No traumatic malalignment. Skull base and vertebrae: No fracture or focal lesion. Soft tissues and spinal canal: Negative Disc levels: Chronic facet osteoarthritis at C2-3, C3-4, C4-5 and C7-T1. Chronic degenerative spondylosis with disc space narrowing throughout the cervical region. No apparent compressive canal stenosis. Some osteophytic foraminal narrowing from C3-4 through C7-T1, most pronounced at the C3-4 and C4-5 levels. Upper chest: Pleural and parenchymal scarring. Other: None IMPRESSION: Study suffer from motion degradation. This limits sensitivity for subtle findings. Head CT: Generalized atrophy.  No sign of focal or acute finding. Cervical spine CT: No acute or traumatic finding. Chronic degenerative spondylosis and facet arthropathy. Foraminal narrowing most pronounced at C3-4 and C4-5. Electronically Signed   By: Paulina Fusi M.D.   On: 07/06/2019 23:17   CT PELVIS WO CONTRAST  Result Date: 07/06/2019 CLINICAL DATA:  86 year old male with fall and hip pain. EXAM: CT PELVIS WITHOUT CONTRAST TECHNIQUE: Multidetector CT imaging of the pelvis was performed following the standard protocol without intravenous contrast. COMPARISON:  Pelvic radiograph dated 07/06/2019. FINDINGS: Evaluation is limited due to streak artifact caused by metallic left hip arthroplasty. Urinary Tract: Severely distended urinary bladder concerning for bladder outlet obstruction. Clinical correlation is recommended. Consider placement of the Foley catheter if the patient is unable to void. Bowel:  No bowel  dilatation in the visualized pelvis. Vascular/Lymphatic: Advanced aortoiliac atherosclerotic disease. No adenopathy noted within the pelvis. Reproductive: Mildly enlarged prostate gland measuring 4.7 cm in transverse axial diameter. Apparent post procedural changes of TURP. Other:  None Musculoskeletal: Evaluation for fracture is limited due to advanced osteopenia. No acute fracture or dislocation. Severe arthritic changes of the right hip with near complete joint space loss and bone-on-bone contact. IMPRESSION: 1. No acute fracture or dislocation. 2. Osteopenia and severe osteoarthritic changes of the right hip. 3. Severely distended urinary bladder concerning for bladder outlet obstruction. Clinical correlation is recommended. 4. Aortic Atherosclerosis (ICD10-I70.0). Electronically Signed   By: Elgie Collard M.D.   On: 07/06/2019 23:37        Scheduled Meds: . enoxaparin (LOVENOX) injection  40 mg Subcutaneous Q24H   Continuous Infusions: . sodium chloride 100 mL/hr at 07/07/19 0155  . cefTRIAXone (ROCEPHIN)  IV       LOS: 0 days    Time spent: 30 mins     Charise Killian, MD [facility] Pager [phone]  If 7PM-7AM, please contact night-coverage [email or web address] 07/07/2019, 7:29 AM

## 2019-07-08 ENCOUNTER — Encounter: Payer: Self-pay | Admitting: Internal Medicine

## 2019-07-08 DIAGNOSIS — E876 Hypokalemia: Secondary | ICD-10-CM

## 2019-07-08 LAB — BASIC METABOLIC PANEL
Anion gap: 7 (ref 5–15)
BUN: 16 mg/dL (ref 8–23)
CO2: 24 mmol/L (ref 22–32)
Calcium: 7.9 mg/dL — ABNORMAL LOW (ref 8.9–10.3)
Chloride: 110 mmol/L (ref 98–111)
Creatinine, Ser: 0.9 mg/dL (ref 0.61–1.24)
GFR calc Af Amer: >60 mL/min (ref >60)
GFR calc non Af Amer: >60 mL/min (ref >60)
Glucose, Bld: 94 mg/dL (ref 70–99)
Potassium: 3.4 mmol/L — ABNORMAL LOW (ref 3.5–5.1)
Sodium: 141 mmol/L (ref 135–145)

## 2019-07-08 LAB — CBC
HCT: 30.2 % — ABNORMAL LOW (ref 39.0–52.0)
Hemoglobin: 10 g/dL — ABNORMAL LOW (ref 13.0–17.0)
MCH: 31.7 pg (ref 26.0–34.0)
MCHC: 33.1 g/dL (ref 30.0–36.0)
MCV: 95.9 fL (ref 80.0–100.0)
Platelets: 172 10*3/uL (ref 150–400)
RBC: 3.15 MIL/uL — ABNORMAL LOW (ref 4.22–5.81)
RDW: 13.2 % (ref 11.5–15.5)
WBC: 9.4 10*3/uL (ref 4.0–10.5)
nRBC: 0 % (ref 0.0–0.2)

## 2019-07-08 MED ORDER — POTASSIUM CHLORIDE 20 MEQ PO PACK
20.0000 meq | PACK | Freq: Once | ORAL | Status: AC
  Administered 2019-07-08: 20 meq via ORAL
  Filled 2019-07-08: qty 1

## 2019-07-08 NOTE — Evaluation (Signed)
Physical Therapy Evaluation Patient Details Name: Victor Jones MRN: [id number] DOB: [birth year] Today's Date: 07/08/2019   History of Present Illness  Pt is an 86 y.o. male presenting to hospital 07/06/19 with mechanical fall, weakness, and increased confusion compared to baseline.  Pt admitted with UTI, acute urinary retention, acute metabolic encephalopathy, AKI, and fall.  PMH includes dementia, CAD with stents, and chronic LE edema.  Clinical Impression  Prior to hospital admission, pt was ambulatory with RW (pt's wife provided supervision for safety with functional mobility and ADL's) and lives with his wife in 1 level apt with level entry.  Currently pt is min assist with transfers and min to mod assist x2 to walk a few feet forward and a few feet backward with RW.  Pt requiring assist for clean-up upon standing from recliner d/t bowel incontinence.  2 assist required for any other functional mobility d/t pt pushing walker significantly forward with significantly forward flexed posture and difficult to maintain safety with max cueing.  Pt would benefit from skilled PT to address noted impairments and functional limitations (see below for any additional details).  Upon hospital discharge, pt would benefit from STR.    Follow Up Recommendations SNF    Equipment Recommendations  Rolling walker with 5" wheels;3in1 (PT)    Recommendations for Other Services OT consult     Precautions / Restrictions Precautions Precautions: Fall Restrictions Weight Bearing Restrictions: No      Mobility  Bed Mobility Overal bed mobility: Needs Assistance Bed Mobility: Supine to Sit     Supine to sit: Min assist;HOB elevated     General bed mobility comments: deferred (pt up in chair beginning/end of session)  Transfers Overall transfer level: Needs assistance Equipment used: Rolling walker (2 wheeled) Transfers: Sit to/from Stand Sit to Stand: Min assist         General transfer comment:  assist to initiate and come to full stand up to walker; assist to control descent sitting down into chair; x3 trials each  Ambulation/Gait Ambulation/Gait assistance: Min assist;Mod assist;+2 physical assistance;+2 safety/equipment Gait Distance (Feet): (3 feet forward; 3 feet backward) Assistive device: Rolling walker (2 wheeled)   Gait velocity: decreased   General Gait Details: pt pushing RW significantly forward causing significant forward lean with arms outstretched in front of pt requiring significant assist of therapist and therapy tech to keep walker closer and to keep pt safe; pt would not walk forward after 3 feet and then started walking backward towards chair on his own with 2 assist for safety; decreased B LE step length/foot clearance/heelstrike  Stairs            Wheelchair Mobility    Modified Rankin (Stroke Patients Only)       Balance Overall balance assessment: Needs assistance Sitting-balance support: No upper extremity supported;Feet supported Sitting balance-Leahy Scale: Good Sitting balance - Comments: steady sitting reaching within BOS   Standing balance support: Single extremity supported Standing balance-Leahy Scale: Fair Standing balance comment: pt requiring at least single UE support for static standing balance; tends to prefer flexed positioning                             Pertinent Vitals/Pain Pain Assessment: Faces Faces Pain Scale: No hurt Pain Intervention(s): Limited activity within patient's tolerance;Monitored during session;Repositioned  Vitals (HR and O2 on room air) stable and WFL throughout treatment session.    Home Living Family/patient expects to be discharged to:: Private  residence Living Arrangements: Spouse/significant other Available Help at Discharge: Family;Available 24 hours/day;Personal care attendant;Available PRN/intermittently(PCA 2 hours/day M-F) Type of Home: Apartment Home Access: Level entry      Home Layout: One level Home Equipment: Walker - 2 wheels;Walker - 4 wheels;Cane - single point;Shower seat;Grab bars - toilet;Grab bars - tub/shower Additional Comments: Sp[facility] reports [facility] as the stool does not appear secure enough, and [facility] is ordering toilet riser.    Prior Function Level of Independence: Needs assistance   Gait / Transfers Assistance Needed: Pt's spouse reports pt was ambulating with SPC about 1 month ago but d/t falls has switched over to using RW.  Pt ambulates with supervision from spouse.  Per OT note, "pt also has 4ww, but was using incorrectly/unsafe".  ADL's / Homemaking Assistance Needed: Assist from her and PCA for bathing and dressing; pt's wife supervises all ADL's.  Comments: 2 falls in past month     Hand Dominance        Extremity/Trunk Assessment   Upper Extremity Assessment Upper Extremity Assessment: Defer to OT evaluation    Lower Extremity Assessment Lower Extremity Assessment: Generalized weakness;Difficult to assess due to impaired cognition    Cervical / Trunk Assessment Cervical / Trunk Assessment: Kyphotic  Communication   Communication: No difficulties  Cognition Arousal/Alertness: Awake/alert Behavior During Therapy: Impulsive Overall Cognitive Status: No family/caregiver present to determine baseline cognitive functioning                                 General Comments: inconsistent with following 1 step commands; pleasantly confused; able to state name only      General Comments   Nursing cleared pt for participation in physical therapy.  Pt agreeable to PT session.  Pt's wife present when therapist initially attempted to see pt but pt was eating lunch so therapist obtained PLOF and home info from pt's wife.  Pt's wife not present when therapist re-attempted to see pt for evaluation.    Exercises    Assessment/Plan    PT Assessment Patient needs continued PT services  PT  Problem List Decreased strength;Decreased balance;Decreased mobility;Decreased cognition;Decreased knowledge of use of DME;Decreased safety awareness;Decreased knowledge of precautions       PT Treatment Interventions DME instruction;Gait training;Functional mobility training;Therapeutic activities;Therapeutic exercise;Balance training;Patient/family education    PT Goals (Current goals can be found in the Care Plan section)  Acute Rehab PT Goals Patient Stated Goal: to walk more PT Goal Formulation: With patient Time For Goal Achievement: 07/22/19 Potential to Achieve Goals: Good    Frequency Min 2X/week   Barriers to discharge Decreased caregiver support      Co-evaluation               AM-PAC PT "6 Clicks" Mobility  Outcome Measure Help needed turning from your back to your side while in a flat bed without using bedrails?: A Little Help needed moving from lying on your back to sitting on the side of a flat bed without using bedrails?: A Little Help needed moving to and from a bed to a chair (including a wheelchair)?: A Little Help needed standing up from a chair using your arms (e.g., wheelchair or bedside chair)?: A Little Help needed to walk in hospital room?: A Lot Help needed climbing 3-5 steps with a railing? : A Lot 6 Click Score: 16    End of Session Equipment Utilized  During Treatment: Gait belt Activity Tolerance: Patient tolerated treatment well Patient left: in chair;with call bell/phone within reach;with chair alarm set;Other (comment)(B heels floating via pillow) Nurse Communication: Mobility status;Precautions(white board and NT notified) PT Visit Diagnosis: Other abnormalities of gait and mobility (R26.89);Muscle weakness (generalized) (M62.81);History of falling (Z91.81);Difficulty in walking, not elsewhere classified (R26.2)    Time: 4259-5638 PT Time Calculation (min) (ACUTE ONLY): 31 min   Charges:   PT Evaluation $PT Eval Low Complexity: 1  Low PT Treatments $Therapeutic Activity: 8-22 mins       Hendricks Limes, PT 07/08/19, 1:59 PM

## 2019-07-08 NOTE — Progress Notes (Signed)
Spoke with Dr. Mayford Knife about the patient's catheter. Order received to pull foley

## 2019-07-08 NOTE — Evaluation (Addendum)
Occupational Therapy Evaluation Patient Details Name: Victor Jones MRN: [id number] DOB: 1928-08-04 Today's Date: 07/08/2019    History of Present Illness Pt is an 86 y.o. male presenting to hospital 07/06/19 with mechanical fall, weakness, and increased confusion compared to baseline.  Pt admitted with UTI, acute urinary retention, acute metabolic encephalopathy, AKI, and fall.  PMH includes dementia, CAD with stents, and chronic LE edema.   Clinical Impression   Pt was seen for OT evaluation this date. He is a pleasantly demented fellow who is in good spirits throughout session. Prior to hospital admission, pt was MOD I to Supv (spouse supervising) with fxl mobility with RW (previously using [place], but spouse endorsed increased falls with cane). Pt lives in one level apt with spouse with level entry. Currently pt demonstrates impairments as described below (See OT problem list) which functionally limit his ability to perform ADL/self-care tasks. Pt currently requires MIN A with ADL transfers/mobility, MIN A for UB/LB dressing, and setup with sequencing cues for grooming/hygiene tasks in sitting.  OT facilitates education with pt and spouse (spouse via telephone) re: role of OT in acute setting and home recommendations for fall prevention. Spouse with good reception of education, pt with minimal to no reception of education detected, primarily responds yes/no arbitrarily to all topics during OT session. Pt would benefit from skilled OT to address noted impairments and functional limitations (see below for any additional details) in order to maximize safety and independence while minimizing falls risk and caregiver burden. Upon hospital discharge, recommend STR to maximize pt safety and return to PLOF.     Follow Up Recommendations  SNF    Equipment Recommendations  3 in 1 bedside commode    Recommendations for Other Services       Precautions / Restrictions Precautions Precautions:  Fall Restrictions Weight Bearing Restrictions: No      Mobility Bed Mobility Overal bed mobility: Needs Assistance Bed Mobility: Supine to Sit     Supine to sit: Min assist;HOB elevated        Transfers Overall transfer level: Needs assistance Equipment used: Rolling walker (2 wheeled) Transfers: Sit to/from Stand Sit to Stand: Min assist;From elevated surface              Balance Overall balance assessment: Needs assistance Sitting-balance support: Feet supported Sitting balance-Leahy Scale: Good     Standing balance support: Bilateral upper extremity supported Standing balance-Leahy Scale: Fair Standing balance comment: MIN A with RW to sustain static standing. Demos somewhat flexed hips and cervical spine in standing.                           ADL either performed or assessed with clinical judgement   ADL Overall ADL's : Needs assistance/impaired Eating/Feeding: Set up;Sitting   Grooming: Wash/dry hands;Wash/dry face;Oral care;Set up;Cueing for sequencing;Sitting           Upper Body Dressing : Minimal assistance;Sitting;Cueing for sequencing   Lower Body Dressing: Minimal assistance;Bed level;Cueing for sequencing Lower Body Dressing Details (indicate cue type and reason): long sitting in bed Toilet Transfer: Minimal assistance;Stand-pivot;RW;BSC     Toileting - Clothing Manipulation Details (indicate cue type and reason): catheter in place at this time     Functional mobility during ADLs: Minimal assistance;Rolling walker(pt takes 6-7 small shuffling steps from bed to recliner with RW with MIN/MOD multimodal cues for safe sequencing.)       Vision Patient Visual Report: No change from baseline Additional Comments:  difficult to formally assess, but pt appears to track appropriately.     Perception     Praxis      Pertinent Vitals/Pain Pain Assessment: No/denies pain     Hand Dominance     Extremity/Trunk Assessment Upper  Extremity Assessment Upper Extremity Assessment: Generalized weakness(somewhat difficult to assess d/t pt difficulty with sequencing/command following. Pt shld, elbow, grip all grossly 3+/5 bilaterally)   Lower Extremity Assessment Lower Extremity Assessment: Defer to PT evaluation;Generalized weakness   Cervical / Trunk Assessment Cervical / Trunk Assessment: Kyphotic   Communication Communication Communication: No difficulties   Cognition Arousal/Alertness: Awake/alert Behavior During Therapy: WFL for tasks assessed/performed Overall Cognitive Status: History of cognitive impairments - at baseline                                 General Comments: pt requires MIN/MOD tactile/verbal/visual cues to sequence and for directions with taking steps. Pt is pleasantly confused and can generally follow commands, but is not oriented to anything but self, and is unable to state his birthday (just says "[birth year]")   General Comments       Exercises Other Exercises Other Exercises: OT attempts to facilitate education re: role of OT with pt, but very poor reception. OT facilitates education with pt's spouse via telephone and MOD/MAX reception detected.   Shoulder Instructions      Home Living Family/patient expects to be discharged to:: Private residence Living Arrangements: Spouse/significant other Available Help at Discharge: Family;Available 24 hours/day;Personal care attendant;Available PRN/intermittently(PCA 2hrs/day M-F) Type of Home: Apartment Home Access: Level entry     Home Layout: One level     Bathroom Shower/Tub: Chief Strategy Officer: Standard     Home Equipment: Environmental consultant - 2 wheels;Walker - 4 wheels;Cane - single point;Shower seat;Grab bars - toilet;Grab bars - tub/shower   Additional Comments: Sp[facility] reports [facility] as the stool does not appear secure enough, and [facility] is [facility] toilet ris[facility].      Prior  Functioning/Environment Level of Independence: Needs assistance  Gait / Transfers Assistance Needed: Pt was using SPC up until a fall 1 MA per spouse. She now encourages use of RW. Pt walked with Supv from spouse. Of note: pt also has 4WW, but was using incorrectly/unsafe ADL's / Homemaking Assistance Needed: Pt is questionable historian. Spouse contacted via telephone and reports pt required assist from her and PCA for bathing, dressing. States that pt was performing ADL transfers, mobility, toileting, and grooming without assist prior to fall , but she now at least supervises all ADLs.   Comments: Spouse endorses fall 1 MA, fall morning prior to being admitted (EMS called and got pt up), and states later in evening of same day, pt would not get up off commode despite dtr and son-in-law being called to assist so EMS called a second time at which point they took him to hospital.        OT Problem List: Decreased strength;Decreased activity tolerance;Impaired balance (sitting and/or standing);Decreased cognition;Decreased safety awareness;Decreased knowledge of use of DME or AE      OT Treatment/Interventions: Self-care/ADL training;Therapeutic exercise;DME and/or AE instruction;Therapeutic activities;Patient/family education;Balance training    OT Goals(Current goals can be found in the care plan section) Acute Rehab OT Goals OT Goal Formulation: Patient unable to participate in goal setting Time For Goal Achievement: 07/22/19 Potential to Achieve Goals: Good  OT Frequency: Min 2X/week  Barriers to D/C:            Co-evaluation              AM-PAC OT "6 Clicks" Daily Activity     Outcome Measure Help from another person eating meals?: A Little Help from another person taking care of personal grooming?: A Little Help from another person toileting, which includes using toliet, bedpan, or urinal?: A Lot Help from another person bathing (including washing, rinsing, drying)?: A  Lot Help from another person to put on and taking off regular upper body clothing?: A Little Help from another person to put on and taking off regular lower body clothing?: A Little 6 Click Score: 16   End of Session Equipment Utilized During Treatment: Gait belt;Rolling walker Nurse Communication: Other (comment)(communicated to CNA that pt arbitrarily says yes/no related to participation in ADLs)  Activity Tolerance: Patient tolerated treatment well Patient left: in chair;with call bell/phone within reach;with chair alarm set  OT Visit Diagnosis: Unsteadiness on feet (R26.81);Muscle weakness (generalized) (M62.81);History of falling (Z91.81)                Time: 9357-0177 OT Time Calculation (min): 26 min Charges:  OT General Charges $OT Visit: 1 Visit OT Evaluation $OT Eval Moderate Complexity: 1 Mod OT Treatments $Self Care/Home Management : 8-22 mins  Rejeana Brock, MS, OTR/L ascom [phone] 07/08/19, 10:46 AM

## 2019-07-08 NOTE — Plan of Care (Signed)
  Problem: Education: Goal: Knowledge of General Education information will improve Description: Including pain rating scale, medication(s)/side effects and non-pharmacologic comfort measures Outcome: Progressing   Problem: Health Behavior/Discharge Planning: Goal: Ability to manage health-related needs will improve Outcome: Progressing   Problem: Clinical Measurements: Goal: Ability to maintain clinical measurements within normal limits will improve Outcome: Progressing Goal: Will remain free from infection Outcome: Progressing Goal: Diagnostic test results will improve Outcome: Progressing Goal: Respiratory complications will improve Outcome: Progressing Goal: Cardiovascular complication will be avoided Outcome: Progressing   Problem: Activity: Goal: Risk for activity intolerance will decrease Outcome: Progressing   Problem: Nutrition: Goal: Adequate nutrition will be maintained Outcome: Progressing   Problem: Coping: Goal: Level of anxiety will decrease Outcome: Progressing   Problem: Elimination: Goal: Will not experience complications related to bowel motility Outcome: Progressing Goal: Will not experience complications related to urinary retention Outcome: Progressing   Problem: Skin Integrity: Goal: Risk for impaired skin integrity will decrease Outcome: Progressing   

## 2019-07-08 NOTE — Evaluation (Signed)
Clinical/Bedside Swallow Evaluation Patient Details  Name: Victor Jones MRN: [id number] Date of Birth: 1928-07-28  Today's Date: 07/08/2019 Time: SLP Start Time (ACUTE ONLY): 1420 SLP Stop Time (ACUTE ONLY): 1448 SLP Time Calculation (min) (ACUTE ONLY): 28 min  Past Medical History: No past medical history on file. Past Surgical History: The histories are not reviewed yet. Please review them in the "History" navigator section and refresh this SmartLink. HPI:  Per admitting H&P: Victor Jones is a 86 y.o. male with medical history significant for dementia, coronary artery disease with history of stent angioplasty, followed by Dr. Lady Gary as well as history of chronic lower extremity edema, who was brought into the emergency room following a fall.  Patient has dementia so most of history is taken from ER records History from wife at bedside.  Patient apparently fell while he was trying to get into the recliner and was unable to get up.  EMS was able to get patient up to chair he remained for several hours and appeared more confused than usual and he was thus brought back to the emergency room.  No reports of vomiting or diarrhea and no prior reports of pain complaints   Assessment / Plan / Recommendation Clinical Impression  This very pleasant 86 y/o male w/hx of dementia appears to present with largely functional swallowing abilities at bedside. No overt s/s aspiration observed with any consistency tested (thin liquid by cup, puree, soft solid). Pt refused trials thin liquid by straw. Unable to fully complete oral mech exam, d/t limited ability to follow directions.  However, noted oral phase of swallow appeared largely [facility] w/ adequate oral prep/coordination and A-P transit time with all consistencies tested. No oral residue observed. It is significant to note pt currently w/loose lower dentures, possibly secondary to weight loss, no family currently available to question re: dentures. Solids not tested due  to ill-fitting dentures and decreased cognitive status. No overt s/s pharyngeal dysphagia observed. Swallow initiation appeared timely. Vocal quality remained clear thoughout evaluation. Nursing reports toleration of current Dysphagia II diet w/thin liquids w/no s/s aspiration. Educated pt re: aspiration precautions and general safe swallow recommendations. Pt stated agreement but due to cognitive deficits did not demonstrate understanding. Recommend continue with current Dysphagia II (FINELY CHOPPED) diet with thin liquids, NO STRAWS d/t decreased cognition/judgement/safety awareness,  may give meds whole with puree (crushed as needed). Rec. Dietician consult, PO intake is low and pt is at risk for meeting nutritional needs. Discussed results of evaluation with nursing, nursing in agreement. SLP to sign off at this time, no acute needs identified. Please re-consult with any future change in status requiring re-assessment. SLP Visit Diagnosis: Dysphagia, unspecified (R13.10)    Aspiration Risk  Mild aspiration risk;Risk for inadequate nutrition/hydration    Diet Recommendation Dysphagia 2 (Fine chop);Thin liquid   Liquid Administration via: Cup;No straw Medication Administration: Whole meds with puree(crushed if necessary) Supervision: Staff to assist with self feeding Compensations: Minimize environmental distractions;Slow rate;Small sips/bites Postural Changes: Seated upright at 90 degrees;Remain upright for at least 30 minutes after po intake    Other  Recommendations Recommended Consults: Other (Comment)(Dietician) Oral Care Recommendations: Oral care QID;Staff/trained caregiver to provide oral care   Follow up Recommendations None      Frequency and Duration            Prognosis Prognosis for Safe Diet Advancement: Good Barriers to Reach Goals: Cognitive deficits      Swallow Study   General Date of Onset: 07/08/19 HPI: Per  admitting H&P: Victor Jones is a 86 y.o. male with  medical history significant for dementia, coronary artery disease with history of stent angioplasty, followed by Dr. Ubaldo Glassing as well as history of chronic lower extremity edema, who was brought into the emergency room following a fall.  Patient has dementia so most of history is taken from ER records History from wife at bedside.  Patient apparently fell while he was trying to get into the recliner and was unable to get up.  EMS was able to get patient up to chair he remained for several hours and appeared more confused than usual and he was thus brought back to the emergency room.  No reports of vomiting or diarrhea and no prior reports of pain complaints Type of Study: Bedside Swallow Evaluation Diet Prior to this Study: Dysphagia 2 (chopped);Thin liquids Temperature Spikes Noted: Yes(pt w/UTI) Respiratory Status: Room air History of Recent Intubation: No Behavior/Cognition: Alert;Cooperative;Pleasant mood;Requires cueing;Doesn't follow directions Oral Cavity Assessment: Dry Oral Cavity - Dentition: Dentures, top;Dentures, bottom;Other (Comment)(bottom dentures very loose) Vision: Functional for self-feeding Self-Feeding Abilities: Able to feed self;Needs assist;Needs set up Patient Positioning: Upright in chair Baseline Vocal Quality: Normal Volitional Cough: Cognitively unable to elicit Volitional Swallow: Unable to elicit    Oral/Motor/Sensory Function Overall Oral Motor/Sensory Function: Within functional limits   Ice Chips Ice chips: Not tested   Thin Liquid Thin Liquid: Within functional limits Presentation: Cup;Self Fed Other Comments: (Pt reported he doesn't use straws)    Nectar Thick Nectar Thick Liquid: Not tested   Honey Thick Honey Thick Liquid: Not tested   Puree Puree: Within functional limits   Solid     Solid: Within functional limits(soft solids) Presentation: Darrol Jump, MA, CCC-SLP 07/08/2019,3:06 PM

## 2019-07-08 NOTE — Progress Notes (Addendum)
PROGRESS NOTE    Victor Jones  JIR:[id number] DOB: Feb 09, 1929 DOA: 07/06/2019 PCP: Marina Goodell, MD       Assessment & Plan:   Principal Problem:   Urinary tract infection Active Problems:   Acute urinary retention   Fall at home, initial encounter   Acute metabolic encephalopathy   Dementia with behavioral disturbance (HCC)   CAD (coronary artery disease)   AKI (acute kidney injury) (HCC)   UTI (urinary tract infection)   UTI: UA shows rare bacteria,mod leukocytes, urine cx is pending. Spiking low grade fevers. Continue on IV rocephin.    Acute urinary retention: d/c foley and voiding trial today. Will continue to monitor  Hypokalemia: KCl repleated. Will continue to monitor   Acute metabolic encephalopathy: likely secondary to above but also dementia. Management as stated above  Leukocytosis: resolved   AKI: resolved  Fall: at home. PT/OT recs SNF  Dementia: haldol prn for agitation  CAD: continue on home dose of aspirin, simvastain   DVT prophylaxis: lovenox  Code Status: full Family Communication:  Disposition Plan:.likely several days more as the pt clinically improves    Consultants:   n/a   Procedures:    Antimicrobials: rocephin    Subjective: Pt is oriented to person only. Pt is still unable to answer any questions appropriately  Objective: Vitals:   07/07/19 0800 07/07/19 1634 07/07/19 2348 07/08/19 0727  BP: (!) 144/76 101/62 119/67 104/60  Pulse: 83 81 88 72  Resp: 18 18 16 16   Temp: (!) 97.5 F (36.4 C) 97.8 F (36.6 C) (!) 100.5 F (38.1 C) 99.8 F (37.7 C)  TempSrc: Oral Oral Oral Oral  SpO2: 100% 100% 98% 98%  Weight:      Height:        Intake/Output Summary (Last 24 hours) at 07/08/2019 0733 Last data filed at 07/08/2019 0700 Gross per 24 hour  Intake 3289.1 ml  Output 2000 ml  Net 1289.1 ml   Filed Weights   07/06/19 1908  Weight: 60.2 kg    Examination:  General exam: Appears calm and comfortable    Respiratory system: Clear to auscultation. No wheezes Cardiovascular system: S1 & S2 +. No rubs, gallops or clicks. Gastrointestinal system: Abdomen is nondistended, soft and nontender.  Normal bowel sounds heard. Central nervous system: Alert and oriented to person only.  Psychiatry: Judgement and insight appear abnormal. Flat mood and affect    Data Reviewed: I have personally reviewed following labs and imaging studies  CBC: Recent Labs  Lab 07/06/19 1911 07/07/19 0539 07/08/19 0410  WBC 10.7* 11.0* 9.4  HGB 12.1* 11.6* 10.0*  HCT 35.7* 34.5* 30.2*  MCV 93.7 94.0 95.9  PLT 213 186 172   Basic Metabolic Panel: Recent Labs  Lab 07/06/19 1911 07/07/19 0539 07/08/19 0410  NA 136 141 141  K 3.7 3.8 3.4*  CL 100 104 110  CO2 28 27 24   GLUCOSE 126* 107* 94  BUN 23 20 16   CREATININE 1.30* 1.25* 0.90  CALCIUM 9.0 8.7* 7.9*   GFR: Estimated Creatinine Clearance: 48.3 mL/min (by C-G formula based on SCr of 0.9 mg/dL). Liver Function Tests: Recent Labs  Lab 07/06/19 1911  AST 31  ALT 24  ALKPHOS 94  BILITOT 1.6*  PROT 6.8  ALBUMIN 4.1   No results for input(s): LIPASE, AMYLASE in the last 168 hours. No results for input(s): AMMONIA in the last 168 hours. Coagulation Profile: No results for input(s): INR, PROTIME in the last 168 hours. Cardiac Enzymes:  No results for input(s): CKTOTAL, CKMB, CKMBINDEX, TROPONINI in the last 168 hours. BNP (last 3 results) No results for input(s): PROBNP in the last 8760 hours. HbA1C: No results for input(s): HGBA1C in the last 72 hours. CBG: No results for input(s): GLUCAP in the last 168 hours. Lipid Profile: No results for input(s): CHOL, HDL, LDLCALC, TRIG, CHOLHDL, LDLDIRECT in the last 72 hours. Thyroid Function Tests: No results for input(s): TSH, T4TOTAL, FREET4, T3FREE, THYROIDAB in the last 72 hours. Anemia Panel: No results for input(s): VITAMINB12, FOLATE, FERRITIN, TIBC, IRON, RETICCTPCT in the last 72  hours. Sepsis Labs: No results for input(s): PROCALCITON, LATICACIDVEN in the last 168 hours.  Recent Results (from the past 240 hour(s))  SARS CORONAVIRUS 2 (TAT 6-24 HRS) Nasopharyngeal Nasopharyngeal Swab     Status: None   Collection Time: 07/06/19 11:58 PM   Specimen: Nasopharyngeal Swab  Result Value Ref Range Status   SARS Coronavirus 2 NEGATIVE NEGATIVE Final    Comment: (NOTE) SARS-CoV-2 target nucleic acids are NOT DETECTED. The SARS-CoV-2 RNA is generally detectable in upper and lower respiratory specimens during the acute phase of infection. Negative results do not preclude SARS-CoV-2 infection, do not rule out co-infections with other pathogens, and should not be used as the sole basis for treatment or other patient management decisions. Negative results must be combined with clinical observations, patient history, and epidemiological information. The expected result is Negative. Fact Sheet for Patients: HairSlick.no Fact Sheet for Healthcare Providers: quierodirigir.com This test is not yet approved or cleared by the [place] FDA and  has been authorized for detection and/or diagnosis of SARS-CoV-2 by FDA under an Emergency Use Authorization (EUA). This EUA will remain  in effect (meaning this test can be used) for the duration of the COVID-19 declaration under Section 56 4(b)(1) of the Act, 21 U.S.C. section 360bbb-3(b)(1), unless the authorization is terminated or revoked sooner. Performed at [facility], [address] [place], [place] [address]          Radiology Studies: DG Chest 1 View  Result Date: 07/06/2019 CLINICAL DATA:  Altered level of consciousness, confusion, possible urinary tract infection EXAM: CHEST  1 VIEW COMPARISON:  None. FINDINGS: Single frontal view of the chest demonstrates an unremarkable cardiac silhouette. The thoracic aorta is ectatic. Bibasilar interstitial  prominence is noted, without superimposed airspace disease, effusion, or pneumothorax. No acute bony abnormalities. IMPRESSION: 1. Bibasilar interstitial prominence, differential includes interstitial edema versus chronic scarring. 2. No acute airspace disease. Electronically Signed   By: Sharlet Salina M.D.   On: 07/06/2019 19:42   DG Pelvis 1-2 Views  Result Date: 07/06/2019 CLINICAL DATA:  Altered level of consciousness, fell, urinary tract infection EXAM: PELVIS - 1-2 VIEW COMPARISON:  None. FINDINGS: There are 2 frontal views of the pelvis. There is severe right hip osteoarthritis with complete loss of joint space, bony remodeling of the femoral head and acetabulum, subchondral cyst formation, and marked osteophyte formation. Left hip arthroplasty is identified. Lucency surrounding the acetabular prosthesis could be sequela of particle disease. Please correlate with any previous hip prosthesis revision. The current prosthesis appears well aligned. I do not see any acute displaced fractures. Prominent spondylosis at the lumbosacral junction. IMPRESSION: 1. No acute bony abnormality. 2. Severe end-stage right hip osteoarthritis. 3. Left hip arthroplasty as above. Electronically Signed   By: Sharlet Salina M.D.   On: 07/06/2019 19:43   CT Head Wo Contrast  Result Date: 07/06/2019 CLINICAL DATA:  TIA. Altered mental status.  Dementia. Patient uncooperative. EXAM: CT HEAD WITHOUT CONTRAST CT CERVICAL SPINE WITHOUT CONTRAST TECHNIQUE: Multidetector CT imaging of the head and cervical spine was performed following the standard protocol without intravenous contrast. Multiplanar CT image reconstructions of the cervical spine were also generated. COMPARISON:  None. FINDINGS: CT HEAD FINDINGS Brain: Study suffers from motion degradation. There is generalized brain atrophy. No sign of acute infarction, mass lesion, hemorrhage, hydrocephalus or extra-axial collection. Vascular: There is atherosclerotic calcification  of the major vessels at the base of the brain. Skull: Negative Sinuses/Orbits: Clear/normal Other: None CT CERVICAL SPINE FINDINGS Motion degradation on this exam as well. Alignment: No traumatic malalignment. Skull base and vertebrae: No fracture or focal lesion. Soft tissues and spinal canal: Negative Disc levels: Chronic facet osteoarthritis at C2-3, C3-4, C4-5 and C7-T1. Chronic degenerative spondylosis with disc space narrowing throughout the cervical region. No apparent compressive canal stenosis. Some osteophytic foraminal narrowing from C3-4 through C7-T1, most pronounced at the C3-4 and C4-5 levels. Upper chest: Pleural and parenchymal scarring. Other: None IMPRESSION: Study suffer from motion degradation. This limits sensitivity for subtle findings. Head CT: Generalized atrophy.  No sign of focal or acute finding. Cervical spine CT: No acute or traumatic finding. Chronic degenerative spondylosis and facet arthropathy. Foraminal narrowing most pronounced at C3-4 and C4-5. Electronically Signed   By: Nelson Chimes M.D.   On: 07/06/2019 23:17   CT Cervical Spine Wo Contrast  Result Date: 07/06/2019 CLINICAL DATA:  TIA. Altered mental status. Dementia. Patient uncooperative. EXAM: CT HEAD WITHOUT CONTRAST CT CERVICAL SPINE WITHOUT CONTRAST TECHNIQUE: Multidetector CT imaging of the head and cervical spine was performed following the standard protocol without intravenous contrast. Multiplanar CT image reconstructions of the cervical spine were also generated. COMPARISON:  None. FINDINGS: CT HEAD FINDINGS Brain: Study suffers from motion degradation. There is generalized brain atrophy. No sign of acute infarction, mass lesion, hemorrhage, hydrocephalus or extra-axial collection. Vascular: There is atherosclerotic calcification of the major vessels at the base of the brain. Skull: Negative Sinuses/Orbits: Clear/normal Other: None CT CERVICAL SPINE FINDINGS Motion degradation on this exam as well. Alignment: No  traumatic malalignment. Skull base and vertebrae: No fracture or focal lesion. Soft tissues and spinal canal: Negative Disc levels: Chronic facet osteoarthritis at C2-3, C3-4, C4-5 and C7-T1. Chronic degenerative spondylosis with disc space narrowing throughout the cervical region. No apparent compressive canal stenosis. Some osteophytic foraminal narrowing from C3-4 through C7-T1, most pronounced at the C3-4 and C4-5 levels. Upper chest: Pleural and parenchymal scarring. Other: None IMPRESSION: Study suffer from motion degradation. This limits sensitivity for subtle findings. Head CT: Generalized atrophy.  No sign of focal or acute finding. Cervical spine CT: No acute or traumatic finding. Chronic degenerative spondylosis and facet arthropathy. Foraminal narrowing most pronounced at C3-4 and C4-5. Electronically Signed   By: Nelson Chimes M.D.   On: 07/06/2019 23:17   CT PELVIS WO CONTRAST  Result Date: 07/06/2019 CLINICAL DATA:  86 year old male with fall and hip pain. EXAM: CT PELVIS WITHOUT CONTRAST TECHNIQUE: Multidetector CT imaging of the pelvis was performed following the standard protocol without intravenous contrast. COMPARISON:  Pelvic radiograph dated 07/06/2019. FINDINGS: Evaluation is limited due to streak artifact caused by metallic left hip arthroplasty. Urinary Tract: Severely distended urinary bladder concerning for bladder outlet obstruction. Clinical correlation is recommended. Consider placement of the Foley catheter if the patient is unable to void. Bowel:  No bowel dilatation in the visualized pelvis. Vascular/Lymphatic: Advanced aortoiliac atherosclerotic disease. No adenopathy noted within the pelvis. Reproductive:  Mildly enlarged prostate gland measuring 4.7 cm in transverse axial diameter. Apparent post procedural changes of TURP. Other:  None Musculoskeletal: Evaluation for fracture is limited due to advanced osteopenia. No acute fracture or dislocation. Severe arthritic changes of the  right hip with near complete joint space loss and bone-on-bone contact. IMPRESSION: 1. No acute fracture or dislocation. 2. Osteopenia and severe osteoarthritic changes of the right hip. 3. Severely distended urinary bladder concerning for bladder outlet obstruction. Clinical correlation is recommended. 4. Aortic Atherosclerosis (ICD10-I70.0). Electronically Signed   By: Elgie Collard M.D.   On: 07/06/2019 23:37        Scheduled Meds: . aspirin  81 mg Oral Daily  . Chlorhexidine Gluconate Cloth  6 each Topical Daily  . enoxaparin (LOVENOX) injection  40 mg Subcutaneous Q24H  . simvastatin  20 mg Oral q1800   Continuous Infusions: . sodium chloride 100 mL/hr at 07/08/19 0700  . cefTRIAXone (ROCEPHIN)  IV Stopped (07/07/19 2357)     LOS: 1 day    Time spent: 30 mins     Charise Killian, MD [facility] Pager [phone]  If 7PM-7AM, please contact night-coverage [email or web address] 07/08/2019, 7:33 AM

## 2019-07-08 NOTE — TOC Initial Note (Signed)
Transition of Care [facility]) - Initial/Assessment Note    Patient Details  Name: Eilan Mcinerny MRN: [id number] Date of Birth: 06-29-1928  [facility] Care [facility]) CM/SW Contact:    Barrie Dunker, RN Phone Number: 07/08/2019, 12:12 PM  Clinical Narrative:                  Spoke with the wife Lupita Raider, The patient lives at home in the apartment with his wife, his daughter provides transportation, he has all the DME at home that he needs, he has a caregiver 2 hours a day provided by the [place], the wife stated that they are fine and she can still care for him as he is., no additional needs  Expected Discharge Plan: Home w Home Health Services Barriers to Discharge: Continued Medical Work up   Patient Goals and CMS Choice Patient states their goals for this hospitalization and ongoing recovery are:: go home with wife      Expected Discharge Plan and Services Expected Discharge Plan: Home w Home Health Services   Discharge Planning Services: CM Consult   Living arrangements for the past 2 months: Apartment                 DME Arranged: N/A         HH Arranged: NA          Prior Living Arrangements/Services Living arrangements for the past 2 months: Apartment Lives with:: Spouse Patient language and need for interpreter reviewed:: Yes Do you feel safe going back to the place where you live?: Yes      Need for Family Participation in Patient Care: No (Comment) Care giver support system in place?: Yes (comment) Current home services: DME(Grab Bars, Shower seat, RW, raised toilet seat) Criminal Activity/Legal Involvement Pertinent to Current Situation/Hospitalization: No - Comment as needed  Activities of Daily Living      Permission Sought/Granted   Permission granted to share information with : Yes, Verbal Permission Granted              Emotional Assessment Appearance:: Appears stated age Attitude/Demeanor/Rapport: Engaged(confused) Affect (typically observed):  Appropriate Orientation: : Oriented to Self Alcohol / Substance Use: Not Applicable Psych Involvement: No (comment)  Admission diagnosis:  UTI (urinary tract infection) [N39.0] Urinary tract infection without hematuria, site unspecified [N39.0] Patient Active Problem List   Diagnosis Date Noted  . Urinary tract infection 07/07/2019  . Acute urinary retention 07/07/2019  . Fall at home, initial encounter 07/07/2019  . Acute metabolic encephalopathy 07/07/2019  . Dementia with behavioral disturbance (HCC) 07/07/2019  . CAD (coronary artery disease) 07/07/2019  . AKI (acute kidney injury) (HCC) 07/07/2019  . UTI (urinary tract infection) 07/07/2019   PCP:  Marina Goodell, MD Pharmacy:   [facility] [address], [place] - [address] [place] [address] Phone: [phone] Fax: [phone]     Social Determinants of Health (SDOH) Interventions    Readmission Risk Interventions No flowsheet data found.

## 2019-07-09 LAB — CBC
HCT: 30 % — ABNORMAL LOW (ref 39.0–52.0)
Hemoglobin: 10.2 g/dL — ABNORMAL LOW (ref 13.0–17.0)
MCH: 32.1 pg (ref 26.0–34.0)
MCHC: 34 g/dL (ref 30.0–36.0)
MCV: 94.3 fL (ref 80.0–100.0)
Platelets: 164 10*3/uL (ref 150–400)
RBC: 3.18 MIL/uL — ABNORMAL LOW (ref 4.22–5.81)
RDW: 12.9 % (ref 11.5–15.5)
WBC: 8.3 10*3/uL (ref 4.0–10.5)
nRBC: 0 % (ref 0.0–0.2)

## 2019-07-09 LAB — BASIC METABOLIC PANEL
Anion gap: 8 (ref 5–15)
BUN: 14 mg/dL (ref 8–23)
CO2: 24 mmol/L (ref 22–32)
Calcium: 7.9 mg/dL — ABNORMAL LOW (ref 8.9–10.3)
Chloride: 110 mmol/L (ref 98–111)
Creatinine, Ser: 0.86 mg/dL (ref 0.61–1.24)
GFR calc Af Amer: >60 mL/min (ref >60)
GFR calc non Af Amer: >60 mL/min (ref >60)
Glucose, Bld: 90 mg/dL (ref 70–99)
Potassium: 3.6 mmol/L (ref 3.5–5.1)
Sodium: 142 mmol/L (ref 135–145)

## 2019-07-09 LAB — URINE CULTURE: Culture: >=100000 — AB

## 2019-07-09 MED ORDER — AMOXICILLIN-POT CLAVULANATE 875-125 MG PO TABS
1.0000 | ORAL_TABLET | Freq: Two times a day (BID) | ORAL | Status: DC
  Administered 2019-07-09 – 2019-07-10 (×3): 1 via ORAL
  Filled 2019-07-09 (×3): qty 1

## 2019-07-09 MED ORDER — TAMSULOSIN HCL 0.4 MG PO CAPS
0.4000 mg | ORAL_CAPSULE | Freq: Every day | ORAL | Status: DC
  Administered 2019-07-09 – 2019-07-10 (×2): 0.4 mg via ORAL
  Filled 2019-07-09 (×2): qty 1

## 2019-07-09 NOTE — Progress Notes (Signed)
Pt had not voided during the shift, he was bladder scanned for 425 ml, obtained an order for I/O, obtained 725 ml of amber clear urine. Pt tolerated. Orders placed, if pt is cath'd more than 3 times and residual is >300 ml, place a foley, and cath PRN if unable to void. In no acute distress at this time.

## 2019-07-09 NOTE — TOC Progression Note (Signed)
Transition of Care [facility]) - Progression Note    Patient Details  Name: Victor Jones MRN: [id number] Date of Birth: Sep 04, 1928  Transition of Care [facility]) CM/SW Contact  Barrie Dunker, RN Phone Number: 07/09/2019, 3:50 PM  Clinical Narrative:     Spoke with the wife [name], she called and requested the physician to give her a call at [phone] or [phone], I sent the physician a message in secure chat requesting her to call the wife  Expected Discharge Plan: Home w Home Health Services Barriers to Discharge: Continued Medical Work up  Expected Discharge Plan and Services Expected Discharge Plan: Home w Home Health Services   Discharge Planning Services: CM Consult   Living arrangements for the past 2 months: Apartment                 DME Arranged: N/A         HH Arranged: NA           Social Determinants of Health (SDOH) Interventions    Readmission Risk Interventions No flowsheet data found.

## 2019-07-09 NOTE — Progress Notes (Signed)
PROGRESS NOTE    Victor Jones  ZOX:[id number] DOB: 04/19/1929 DOA: 07/06/2019 PCP: Marina Goodell, MD       Assessment & Plan:   Principal Problem:   Urinary tract infection Active Problems:   Acute urinary retention   Fall at home, initial encounter   Acute metabolic encephalopathy   Dementia with behavioral disturbance (HCC)   CAD (coronary artery disease)   AKI (acute kidney injury) (HCC)   UTI (urinary tract infection)   UTI: UA shows rare bacteria,mod leukocytes, urine cx is growing aerococcus species, no sens as per micro lab. No fever overnight. Transitioned to po augmentin.   Acute urinary retention: d/c foley and voiding trial but pt still having trouble urinating on his own so in & out cath. Started on tamsulosin. Will continue to monitor  Hypokalemia: WNL today. Will continue to monitor   Acute metabolic encephalopathy: likely secondary to above but also dementia. Management as stated above  Leukocytosis: resolved   AKI: resolved  Fall: at home. PT/OT recs SNF but pt's wife is unsure of what she wants to do   Dementia: haldol prn for agitation  CAD: continue on home dose of aspirin, simvastain   DVT prophylaxis: lovenox  Code Status: full Family Communication: discussed pt's care w/ pt's wife, [name], and answered her questions  Disposition Plan:.likely d/c tomorrow if pt is able to urinate independently     Consultants:   n/a   Procedures:    Antimicrobials: augmentin    Subjective: Pt is oriented to person only. Pt denies any pain. Pt is still unable to answer any questions appropriately  Objective: Vitals:   07/07/19 2348 07/08/19 0727 07/08/19 1506 07/08/19 2312  BP: 119/67 104/60 128/72 125/62  Pulse: 88 72 62 70  Resp: 16 16 17 17   Temp: (!) 100.5 F (38.1 C) 99.8 F (37.7 C) 98.2 F (36.8 C) 98.9 F (37.2 C)  TempSrc: Oral Oral Oral   SpO2: 98% 98% 100% 97%  Weight:      Height:        Intake/Output Summary (Last  24 hours) at 07/09/2019 0741 Last data filed at 07/09/2019 0500 Gross per 24 hour  Intake 1686.41 ml  Output 1676 ml  Net 10.41 ml   Filed Weights   07/06/19 1908  Weight: 60.2 kg    Examination:  General exam: Appears calm and comfortable  Respiratory system: Clear to auscultation. No rales, rhonchi Cardiovascular system: S1 & S2 +. No rubs, gallops or clicks. Gastrointestinal system: Abdomen is nondistended, soft and nontender.  Normal bowel sounds heard. Central nervous system: Alert and oriented to person only.  Psychiatry: Judgement and insight appear abnormal. Flat mood and affect    Data Reviewed: I have personally reviewed following labs and imaging studies  CBC: Recent Labs  Lab 07/06/19 1911 07/07/19 0539 07/08/19 0410 07/09/19 0540  WBC 10.7* 11.0* 9.4 8.3  HGB 12.1* 11.6* 10.0* 10.2*  HCT 35.7* 34.5* 30.2* 30.0*  MCV 93.7 94.0 95.9 94.3  PLT 213 186 172 164   Basic Metabolic Panel: Recent Labs  Lab 07/06/19 1911 07/07/19 0539 07/08/19 0410 07/09/19 0540  NA 136 141 141 142  K 3.7 3.8 3.4* 3.6  CL 100 104 110 110  CO2 28 27 24 24   GLUCOSE 126* 107* 94 90  BUN 23 20 16 14   CREATININE 1.30* 1.25* 0.90 0.86  CALCIUM 9.0 8.7* 7.9* 7.9*   GFR: Estimated Creatinine Clearance: 50.6 mL/min (by C-G formula based on SCr of 0.86  mg/dL). Liver Function Tests: Recent Labs  Lab 07/06/19 1911  AST 31  ALT 24  ALKPHOS 94  BILITOT 1.6*  PROT 6.8  ALBUMIN 4.1   No results for input(s): LIPASE, AMYLASE in the last 168 hours. No results for input(s): AMMONIA in the last 168 hours. Coagulation Profile: No results for input(s): INR, PROTIME in the last 168 hours. Cardiac Enzymes: No results for input(s): CKTOTAL, CKMB, CKMBINDEX, TROPONINI in the last 168 hours. BNP (last 3 results) No results for input(s): PROBNP in the last 8760 hours. HbA1C: No results for input(s): HGBA1C in the last 72 hours. CBG: No results for input(s): GLUCAP in the last 168  hours. Lipid Profile: No results for input(s): CHOL, HDL, LDLCALC, TRIG, CHOLHDL, LDLDIRECT in the last 72 hours. Thyroid Function Tests: No results for input(s): TSH, T4TOTAL, FREET4, T3FREE, THYROIDAB in the last 72 hours. Anemia Panel: No results for input(s): VITAMINB12, FOLATE, FERRITIN, TIBC, IRON, RETICCTPCT in the last 72 hours. Sepsis Labs: No results for input(s): PROCALCITON, LATICACIDVEN in the last 168 hours.  Recent Results (from the past 240 hour(s))  Urine Culture     Status: Abnormal   Collection Time: 07/06/19  9:20 PM   Specimen: Urine, Random  Result Value Ref Range Status   Specimen Description   Final    URINE, RANDOM Performed at [facility], [address] [place], [place] [address]    Special Requests   Final    NONE Performed at [facility], [address] [place], [place] [address]    Culture (A)  Final    >=100,000 COLONIES/mL AEROCOCCUS SPECIES Standardized susceptibility testing for this organism is not available. Performed at [facility], [place] [address] [place], [place] [address]    Report Status 07/09/2019 FINAL  Final  SARS CORONAVIRUS 2 (TAT 6-24 HRS) Nasopharyngeal Nasopharyngeal Swab     Status: None   Collection Time: 07/06/19 11:58 PM   Specimen: Nasopharyngeal Swab  Result Value Ref Range Status   SARS Coronavirus 2 NEGATIVE NEGATIVE Final    Comment: (NOTE) SARS-CoV-2 target nucleic acids are NOT DETECTED. The SARS-CoV-2 RNA is generally detectable in upper and lower respiratory specimens during the acute phase of infection. Negative results do not preclude SARS-CoV-2 infection, do not rule out co-infections with other pathogens, and should not be used as the sole basis for treatment or other patient management decisions. Negative results must be combined with clinical observations, patient history, and epidemiological information. The expected result is Negative. Fact Sheet for Patients:  SugarRoll.be Fact Sheet for Healthcare Providers: https://www.woods-mathews.com/ This test is not yet approved or cleared by the [place] FDA and  has been authorized for detection and/or diagnosis of SARS-CoV-2 by FDA under an Emergency Use Authorization (EUA). This EUA will remain  in effect (meaning this test can be used) for the duration of the COVID-19 declaration under Section 56 4(b)(1) of the Act, 21 U.S.C. section 360bbb-3(b)(1), unless the authorization is terminated or revoked sooner. Performed at [facility], [place] [address] [place], [place] [address]          Radiology Studies: No results found.      Scheduled Meds: . amoxicillin-clavulanate  1 tablet Oral Q12H  . aspirin  81 mg Oral Daily  . Chlorhexidine Gluconate Cloth  6 each Topical Daily  . enoxaparin (LOVENOX) injection  40 mg Subcutaneous Q24H  . simvastatin  20 mg Oral q1800   Continuous Infusions:    LOS: 2 days  Time spent: 32 mins     Charise Killian, MD [facility] Pager [phone]  If 7PM-7AM, please contact night-coverage [email or web address] 07/09/2019, 7:41 AM

## 2019-07-10 DIAGNOSIS — G9341 Metabolic encephalopathy: Secondary | ICD-10-CM

## 2019-07-10 LAB — BASIC METABOLIC PANEL
Anion gap: 9 (ref 5–15)
BUN: 12 mg/dL (ref 8–23)
CO2: 23 mmol/L (ref 22–32)
Calcium: 8 mg/dL — ABNORMAL LOW (ref 8.9–10.3)
Chloride: 107 mmol/L (ref 98–111)
Creatinine, Ser: 0.71 mg/dL (ref 0.61–1.24)
GFR calc Af Amer: >60 mL/min (ref >60)
GFR calc non Af Amer: >60 mL/min (ref >60)
Glucose, Bld: 100 mg/dL — ABNORMAL HIGH (ref 70–99)
Potassium: 3.4 mmol/L — ABNORMAL LOW (ref 3.5–5.1)
Sodium: 139 mmol/L (ref 135–145)

## 2019-07-10 LAB — CBC
HCT: 30.9 % — ABNORMAL LOW (ref 39.0–52.0)
Hemoglobin: 10.6 g/dL — ABNORMAL LOW (ref 13.0–17.0)
MCH: 31.8 pg (ref 26.0–34.0)
MCHC: 34.3 g/dL (ref 30.0–36.0)
MCV: 92.8 fL (ref 80.0–100.0)
Platelets: 168 10*3/uL (ref 150–400)
RBC: 3.33 MIL/uL — ABNORMAL LOW (ref 4.22–5.81)
RDW: 12.6 % (ref 11.5–15.5)
WBC: 9.8 10*3/uL (ref 4.0–10.5)
nRBC: 0 % (ref 0.0–0.2)

## 2019-07-10 MED ORDER — TAMSULOSIN HCL 0.4 MG PO CAPS: 0.4000 mg | ORAL_CAPSULE | Freq: Every day | ORAL | 0 refills | Status: DC

## 2019-07-10 MED ORDER — POTASSIUM CHLORIDE CRYS ER 20 MEQ PO TBCR
40.0000 meq | EXTENDED_RELEASE_TABLET | Freq: Once | ORAL | Status: AC
  Administered 2019-07-10: 40 meq via ORAL
  Filled 2019-07-10: qty 2

## 2019-07-10 MED ORDER — FINASTERIDE 5 MG PO TABS: 5.0000 mg | ORAL_TABLET | Freq: Every day | ORAL | 0 refills | Status: AC

## 2019-07-10 MED ORDER — AMOXICILLIN-POT CLAVULANATE 875-125 MG PO TABS: 1.0000 | ORAL_TABLET | Freq: Two times a day (BID) | ORAL | 0 refills | Status: AC

## 2019-07-10 NOTE — TOC Transition Note (Signed)
Transition of Care [facility]) - CM/SW Discharge Note   Patient Details  Name: Victor Jones MRN: [id number] Date of Birth: [birth year]  [facility] CM/SW Contact:  Barrie Dunker, RN Phone Number: 07/10/2019, 2:10 PM   Clinical Narrative:    Spoke with the patient's wife [name] on the phone.  She stated that she is going to get the Nurse thru the [place] I requested an order that I can fax to the [place] if needed, She will get Foley supplies from the [place] as well, order obtained for the supplies.  The wife stated that they had no additional needs, the caregiver is set up thru the [place] and she will get additional hours and the daughter is going to come for Foley teaching and to pick the patient up for DC .  The wife has my contact information for any additional needs   Final next level of care: Home/Self Care Barriers to Discharge: Continued Medical Work up   Patient Goals and CMS Choice Patient states their goals for this hospitalization and ongoing recovery are:: go home with wife      Discharge Placement                       Discharge Plan and Services   Discharge Planning Services: CM Consult            DME Arranged: N/A         HH Arranged: NA          Social Determinants of Health (SDOH) Interventions     Readmission Risk Interventions No flowsheet data found.

## 2019-07-10 NOTE — Progress Notes (Signed)
D: Pt alert and oriented x 1. Pt denies experiencing any pain at this time.  A: Pt, wife, and daughter received discharge and medication education/information. Pt belongings were gathered and taken with pt upon discharge.   R: Pt, wife, and daughter verbalized understanding of discharge and medication education/information.  Pt escorted to medical mall front lobby by staff via wheelchair where family (wife and daughter) picked pt up.  Pt and family also received foley care education.   Leg bag was placed before pt discharged.

## 2019-07-10 NOTE — Progress Notes (Signed)
Physical Therapy Treatment Patient Details Name: Victor Jones MRN: [id number] DOB: [birth year] Today's Date: 07/10/2019    History of Present Illness Pt is an 86 y.o. male presenting to hospital 07/06/19 with mechanical fall, weakness, and increased confusion compared to baseline.  Pt admitted with UTI, acute urinary retention, acute metabolic encephalopathy, AKI, and fall.  PMH includes dementia, CAD with stents, and chronic LE edema.    PT Comments    Pt resting in bed upon PT arrival.  Able to sit up on edge of bed with SBA.  Pt noted to be incontinent of bowel requiring assist for clean-up in standing.  Pt then able to take steps with walker bed to recliner.  Then pt ambulated 100 feet with RW CGA.  Pt in flexed trunk posture with walker pushed forward but pt overall steady with ambulation; pt's wife present end of session and reports this is how pt typically walks.  PT follow up recommendations updated to HHPT with 24/7 assist (care management notified).  Will continue to focus on strengthening and progressive functional mobility per pt tolerance.   Follow Up Recommendations  Home health PT;Supervision/Assistance - 24 hour     Equipment Recommendations  Rolling walker with 5" wheels;3in1 (PT)    Recommendations for Other Services OT consult     Precautions / Restrictions Precautions Precautions: Fall Restrictions Weight Bearing Restrictions: No    Mobility  Bed Mobility Overal bed mobility: Needs Assistance Bed Mobility: Supine to Sit     Supine to sit: Supervision;HOB elevated     General bed mobility comments: mild increased effort to perform on own  Transfers Overall transfer level: Needs assistance Equipment used: Rolling walker (2 wheeled) Transfers: Sit to/from [employer] Sit to Stand: Min guard Stand pivot transfers: Min guard       General transfer comment: x1 trial from bed and x2 trials from recliner (standing); stand step turn bed to  recliner with RW  Ambulation/Gait Ambulation/Gait assistance: Min guard;+2 safety/equipment(chair follow) Gait Distance (Feet): 100 Feet Assistive device: Rolling walker (2 wheeled)   Gait velocity: decreased   General Gait Details: pt in flexed trunk posture with walker pushed forward but pt overall steady with ambulation   Stairs             Wheelchair Mobility    Modified Rankin (Stroke Patients Only)       Balance Overall balance assessment: Needs assistance Sitting-balance support: No upper extremity supported;Feet supported Sitting balance-Leahy Scale: Good Sitting balance - Comments: steady sitting reaching within BOS   Standing balance support: Single extremity supported Standing balance-Leahy Scale: Fair Standing balance comment: pt requiring at least single UE support for static standing balance; tends to prefer flexed positioning                            Cognition Arousal/Alertness: Awake/alert Behavior During Therapy: Impulsive Overall Cognitive Status: History of cognitive impairments - at baseline                                 General Comments: Inconsistent with answers (saying yes/no mostly during session but not always consistent with conversation)      Exercises     General Comments General comments (skin integrity, edema, etc.): Foley catheter in place at start/end of session (pink/red tinge urine noted upon PT entering room--therapist overhead MD and nurse already discussing this prior to  PT session).  Nursing cleared pt for participation in physical therapy.  Pt agreeable to PT session.      Pertinent Vitals/Pain Pain Assessment: Faces Faces Pain Scale: No hurt Pain Location: Pt denies pain this date. Monitored t/o session. Pain Intervention(s): Monitored during session  Vitals (HR and O2 on room air) stable and WFL throughout treatment session.    Home Living                      Prior Function             PT Goals (current goals can now be found in the care plan section) Acute Rehab PT Goals Patient Stated Goal: to walk more PT Goal Formulation: With patient Time For Goal Achievement: 07/22/19 Potential to Achieve Goals: Good Progress towards PT goals: Progressing toward goals    Frequency    Min 2X/week      PT Plan Discharge plan needs to be updated(Care management notified)    Co-evaluation              AM-PAC PT "6 Clicks" Mobility   Outcome Measure  Help needed turning from your back to your side while in a flat bed without using bedrails?: A Little Help needed moving from lying on your back to sitting on the side of a flat bed without using bedrails?: A Little Help needed moving to and from a bed to a chair (including a wheelchair)?: A Little Help needed standing up from a chair using your arms (e.g., wheelchair or bedside chair)?: A Little Help needed to walk in hospital room?: A Little Help needed climbing 3-5 steps with a railing? : A Little 6 Click Score: 18    End of Session Equipment Utilized During Treatment: Gait belt Activity Tolerance: Patient tolerated treatment well Patient left: in chair;with call bell/phone within reach;with chair alarm set;with family/visitor present Nurse Communication: Mobility status;Precautions PT Visit Diagnosis: Other abnormalities of gait and mobility (R26.89);Muscle weakness (generalized) (M62.81);History of falling (Z91.81);Difficulty in walking, not elsewhere classified (R26.2)     Time: 7353-2992 PT Time Calculation (min) (ACUTE ONLY): 38 min  Charges:  $Gait Training: 8-22 mins $Therapeutic Activity: 23-37 mins                     Leitha Bleak, PT 07/10/19, 3:49 PM

## 2019-07-10 NOTE — Plan of Care (Signed)
  Problem: Clinical Measurements: Goal: Ability to maintain clinical measurements within normal limits will improve Outcome: Progressing Goal: Will remain free from infection Outcome: Progressing Goal: Diagnostic test results will improve Outcome: Progressing Goal: Respiratory complications will improve Outcome: Progressing Goal: Cardiovascular complication will be avoided Outcome: Progressing   Problem: Elimination: Goal: Will not experience complications related to urinary retention Outcome: Not Progressing Note: Patient has been In and out cath x3. Still no void during shift, bladder scanned volume >600. Foley catheter was placed per orders.

## 2019-07-10 NOTE — Progress Notes (Addendum)
Occupational Therapy Treatment Patient Details Name: Victor Jones MRN: [id number] DOB: [birth year] Today's Date: 07/10/2019    History of present illness Pt is an 86 y.o. male presenting to hospital 07/06/19 with mechanical fall, weakness, and increased confusion compared to baseline.  Pt admitted with UTI, acute urinary retention, acute metabolic encephalopathy, AKI, and fall.  PMH includes dementia, CAD with stents, and chronic LE edema.   OT comments  Victor Jones was seen for OT treatment on Victor date. Upon arrival to room pt sitting upright in room recliner with blanket pulled over his head. Pt answers therapist verbally saying "yes" or "no" to questions but remains under blankets. Victor Jones assists pt with removing blanket. Pt greets Victor Chartered loss adjuster and agreeable to seated grooming tasks as described below. Victor Jones engages pt using primarily object prompts to support safety, sequencing, and functional independence Victor date. See ADL section below for additional details. Pt remains pleasantly confused t/o session and declines to participate in fxl mobility/transfer. Pt left seated in recliner with chair alarm set and catheter in place. Pt progressing toward goals, and continues to benefit from skilled OT services to maximize return to PLOF and minimize risk of future falls, injury, caregiver burden, and readmission. Will continue to follow POC as written. Discharge recommendation [facility] to [facility] with 24/7 supervision in consideration of pt progress with therapy sessions.    Follow Up Recommendations  Home health OT;Supervision/Assistance - 24 hour    Equipment Recommendations  3 in 1 bedside commode    Recommendations for Other Services      Precautions / Restrictions Precautions Precautions: Fall Restrictions Weight Bearing Restrictions: No       Mobility Bed Mobility               General bed mobility comments: deferred (pt up in chair beginning/end of session)  Transfers                       Balance Overall balance assessment: Needs assistance Sitting-balance support: No upper extremity supported;Feet supported Sitting balance-Victor Jones: Good Sitting balance - Comments: steady sitting reaching within BOS                                   ADL either performed or assessed with clinical judgement   ADL Overall ADL's : Needs assistance/impaired Eating/Feeding: Set up;Sitting   Grooming: Wash/dry face;Set up;Minimal assistance;Brushing hair;Sitting;Cueing for sequencing Grooming Details (indicate cue type and reason): Pt performs wash/dry face, combing hair, and shaving with medi-razor Victor date. Pt requires min cueing throughout tasks for sequencing and does best with object prompting versus verbal or tactile cuieng. Victor therapist provides min assistance to complete shaving. Despite encouragement, pt declines to perform tasks while standing at the sink Victor date. All grooming/ADL tasks performed while pt remains seated in room recliner w/ feet supported.                       Toileting - Clothing Manipulation Details (indicate cue type and reason): catheter in place at Victor time     Functional mobility during ADLs: Minimal assistance;Cueing for sequencing;Rolling walker       Vision Patient Visual Report: No change from baseline     Perception     Praxis      Cognition Arousal/Alertness: Awake/alert Behavior During Therapy: Impulsive Overall Cognitive Status: History of cognitive impairments - at baseline  General Comments: Pleasantly confused t/o session. Answers yes/no primarily throughout session, but does state "I'm feeling good" when asked about pain. Pt sequences grooming tasks appropriately when given object promts (e.g. a comb or electric razor for facewashing/shaving).        Exercises Other Exercises Other Exercises: OT engages pt in functional grooming  tasks Victor date. See ADL section for detail. Pt pleasantly declines fxl mobility. Remains pleasantly confused t/o session.   Shoulder Instructions       General Comments Catheter in place at start/end of session.    Pertinent Vitals/ Pain       Pain Assessment: Faces Faces Pain Jones: No hurt Pain Location: Pt denies pain Victor date. Monitored t/o session. Pain Intervention(s): Monitored during session  Home Living                                          Prior Functioning/Environment              Frequency  Min 2X/week        Progress Toward Goals  OT Goals(current goals can now be found in the care plan section)  Progress towards OT goals: Progressing toward goals  Acute Rehab OT Goals OT Goal Formulation: Patient unable to participate in goal setting Time For Goal Achievement: 07/22/19 Potential to Achieve Goals: Good  Plan Discharge plan needs to be updated;Frequency remains appropriate    Co-evaluation                 AM-PAC OT "6 Clicks" Daily Activity     Outcome Measure   Help from another person eating meals?: A Little Help from another person taking care of personal grooming?: A Little Help from another person toileting, which includes using toliet, bedpan, or urinal?: A Little Help from another person bathing (including washing, rinsing, drying)?: A Lot Help from another person to put on and taking off regular upper body clothing?: A Little Help from another person to put on and taking off regular lower body clothing?: A Little 6 Click Score: 17    End of Session    OT Visit Diagnosis: Unsteadiness on feet (R26.81);Muscle weakness (generalized) (M62.81);History of falling (Z91.81)   Activity Tolerance Patient tolerated treatment well   Patient Left in chair;with call bell/phone within reach;with chair alarm set   Nurse Communication          Time: 1340-1405 OT Time Calculation (min): 25 min  Charges: OT General  Charges $OT Visit: 1 Visit OT Treatments $Self Care/Home Management : 23-37 mins  Shara Blazing, M.S., OTR/L Ascom: [phone] 07/10/19, 2:30 PM

## 2019-07-10 NOTE — Discharge Summary (Signed)
Victor Jones MBT:[id number] DOB: 28-Sep-1928 DOA: 07/06/2019  PCP: Sofie Hartigan, MD  Admit date: 07/06/2019  Discharge date: 07/10/2019  Admitted From: Home   disposition: Home   Recommendations for Outpatient Follow-up:   Follow-up with urology in 1 week.  Home Health: RN face-to-face for Foley management Equipment/Devices: Foley catheter supplies Consultations: Curbside urology, no formal consultation. Discharge Condition: Improved CODE STATUS: Full Diet Recommendation: Heart Healthy dysphagia 2  Diet Order            Diet - low sodium heart healthy        DIET DYS 2 Room service appropriate? Yes; Fluid consistency: Thin  Diet effective now               Chief Complaint  Patient presents with  . Altered Mental Status     Brief history of present illness from the day of admission and additional interim summary      Victor Jones is a 86 y.o. male with medical history significant for dementia, coronary artery disease with history of stent angioplasty, followed by Dr. Ubaldo Glassing as well as history of chronic lower extremity edema, who was brought into the emergency room following a fall.  Patient has dementia so most of history is taken from ER records History from wife at bedside.  Patient apparently fell while he was trying to get into the recliner and was unable to get up.  EMS was able to get patient up to chair he remained for several hours and appeared more confused than usual and he was thus brought back to the emergency room.  No reports of vomiting or diarrhea and no prior reports of pain complaints  On arrival to the emergency room he was afebrile with BP 133/69 HR 102 O2 sat 99% on room air.  Blood work significant for white cell count of 10,700, creatinine of 1.3, up from baseline of 0.9 at Dr.  Bethanne Ginger office in January 2021.  Analysis showed large leukocyte esterase consistent with UTI.  Underwent extensive trauma imaging including head and C-spine CT which both showed no acute findings.  Hip x-ray showed no fracture and showed a prior stable left hip arthroplasty.  CT abdomen and pelvis did show severely distended urinary bladder concerning for bladder outlet obstruction.  While in the emergency room patient's was treated for agitation with Haldol and lorazepam.  He was also started on IV Rocephin.                                                                   Hospital Course   Patient was treated the Foley catheter for bladder outlet obstruction and ceftriaxone for urinary tract infection.  Patient's mental status steadily improved until it was back to baseline with treatment of infection.  Patient underwent a  voiding trial on 07/08/2019 when Foley catheter was discontinued however patient was unable to void on his own and the Foley catheter was replaced on 07/09/2019.  He was also transitioned to Augmentin p.o. after 3 full days of IV ceftriaxone therapy.  Because patient's mental status was back to baseline, patient was discharged home in the care of his wife and daughter with Foley catheter in place to follow-up with urology in 1 week per their informal recommendation.  UTI Status post 3 days IV Rocephin, transition to p.o. Augmentin to complete a 7-day course.  Bladder outlet obstruction/acute urinary retention Patient failed voiding trial and Foley was replaced. Patient was initiated on finasteride and tamsulosin. Patient to follow-up with urology in 1 week to reassess need for Foley and further management.  Metabolic encephalopathy Improved with treatment of infection Patient back to baseline with dementia  Leukocytosis Resolved  AKI Resolved  CAD No acute issues while in house Continue home medications aspirin and statin.   Discharge diagnosis     Principal  Problem:   Urinary tract infection Active Problems:   Acute urinary retention   Fall at home, initial encounter   Acute metabolic encephalopathy   Dementia with behavioral disturbance (HCC)   CAD (coronary artery disease)   AKI (acute kidney injury) (HCC)   UTI (urinary tract infection)    Discharge instructions    Discharge Instructions    Diet - low sodium heart healthy   Complete by: As directed    Discharge instructions   Complete by: As directed    1.  You will need to see a urologist in 7 to 10 days to see whether you can urinate on your own so the Foley catheter can be removed. 2.  Make sure to take Augmentin, the antibiotic for urinary tract infection, as prescribed for the next 5 days. 3.  Take the new medication called tamsulosin every night at bedtime to help you urinate better. 4.  Take the new medication called finasteride every day, this will also help you urinate better. 5.  Make sure you understand how to take care of the Foley catheter and change the bag.   Increase activity slowly   Complete by: As directed       Discharge Medications   Allergies as of 07/10/2019   No Known Allergies     Medication List    TAKE these medications   amoxicillin-clavulanate 875-125 MG tablet Commonly known as: AUGMENTIN Take 1 tablet by mouth every 12 (twelve) hours for 5 days.   aspirin EC 81 MG tablet Take 81 mg by mouth daily.   finasteride 5 MG tablet Commonly known as: Proscar Take 1 tablet (5 mg total) by mouth daily.   simvastatin 20 MG tablet Commonly known as: ZOCOR Take 20 mg by mouth at bedtime.   tamsulosin 0.4 MG Caps capsule Commonly known as: FLOMAX Take 1 capsule (0.4 mg total) by mouth daily. Start taking on: July 11, 2019   torsemide 10 MG tablet Commonly known as: DEMADEX Take 10 mg by mouth daily.            Durable Medical Equipment  (From admission, onward)         Start     Ordered   07/10/19 1411  For home use only DME  Other see comment  Once    Comments: Foley bags, leg bags, tubing  Question:  Length of Need  Answer:  6 Months   07/10/19 1410  Follow-up Information    Vanna Scotland, MD Follow up in 1 week(s).   Specialty: Urology Why: AM appointment please per Dr Carola Frost information: [address] [place] [address] [phone]           Major procedures and Radiology Reports - PLEASE review detailed and final reports thoroughly  -      DG Chest 1 View  Result Date: 07/06/2019 CLINICAL DATA:  Altered level of consciousness, confusion, possible urinary tract infection EXAM: CHEST  1 VIEW COMPARISON:  None. FINDINGS: Single frontal view of the chest demonstrates an unremarkable cardiac silhouette. The thoracic aorta is ectatic. Bibasilar interstitial prominence is noted, without superimposed airspace disease, effusion, or pneumothorax. No acute bony abnormalities. IMPRESSION: 1. Bibasilar interstitial prominence, differential includes interstitial edema versus chronic scarring. 2. No acute airspace disease. Electronically Signed   By: Sharlet Salina M.D.   On: 07/06/2019 19:42   DG Pelvis 1-2 Views  Result Date: 07/06/2019 CLINICAL DATA:  Altered level of consciousness, fell, urinary tract infection EXAM: PELVIS - 1-2 VIEW COMPARISON:  None. FINDINGS: There are 2 frontal views of the pelvis. There is severe right hip osteoarthritis with complete loss of joint space, bony remodeling of the femoral head and acetabulum, subchondral cyst formation, and marked osteophyte formation. Left hip arthroplasty is identified. Lucency surrounding the acetabular prosthesis could be sequela of particle disease. Please correlate with any previous hip prosthesis revision. The current prosthesis appears well aligned. I do not see any acute displaced fractures. Prominent spondylosis at the lumbosacral junction. IMPRESSION: 1. No acute bony abnormality. 2. Severe end-stage right  hip osteoarthritis. 3. Left hip arthroplasty as above. Electronically Signed   By: Sharlet Salina M.D.   On: 07/06/2019 19:43   CT Head Wo Contrast  Result Date: 07/06/2019 CLINICAL DATA:  TIA. Altered mental status. Dementia. Patient uncooperative. EXAM: CT HEAD WITHOUT CONTRAST CT CERVICAL SPINE WITHOUT CONTRAST TECHNIQUE: Multidetector CT imaging of the head and cervical spine was performed following the standard protocol without intravenous contrast. Multiplanar CT image reconstructions of the cervical spine were also generated. COMPARISON:  None. FINDINGS: CT HEAD FINDINGS Brain: Study suffers from motion degradation. There is generalized brain atrophy. No sign of acute infarction, mass lesion, hemorrhage, hydrocephalus or extra-axial collection. Vascular: There is atherosclerotic calcification of the major vessels at the base of the brain. Skull: Negative Sinuses/Orbits: Clear/normal Other: None CT CERVICAL SPINE FINDINGS Motion degradation on this exam as well. Alignment: No traumatic malalignment. Skull base and vertebrae: No fracture or focal lesion. Soft tissues and spinal canal: Negative Disc levels: Chronic facet osteoarthritis at C2-3, C3-4, C4-5 and C7-T1. Chronic degenerative spondylosis with disc space narrowing throughout the cervical region. No apparent compressive canal stenosis. Some osteophytic foraminal narrowing from C3-4 through C7-T1, most pronounced at the C3-4 and C4-5 levels. Upper chest: Pleural and parenchymal scarring. Other: None IMPRESSION: Study suffer from motion degradation. This limits sensitivity for subtle findings. Head CT: Generalized atrophy.  No sign of focal or acute finding. Cervical spine CT: No acute or traumatic finding. Chronic degenerative spondylosis and facet arthropathy. Foraminal narrowing most pronounced at C3-4 and C4-5. Electronically Signed   By: Paulina Fusi M.D.   On: 07/06/2019 23:17   CT Cervical Spine Wo Contrast  Result Date: 07/06/2019 CLINICAL  DATA:  TIA. Altered mental status. Dementia. Patient uncooperative. EXAM: CT HEAD WITHOUT CONTRAST CT CERVICAL SPINE WITHOUT CONTRAST TECHNIQUE: Multidetector CT imaging of the head and cervical spine was performed following the standard protocol  without intravenous contrast. Multiplanar CT image reconstructions of the cervical spine were also generated. COMPARISON:  None. FINDINGS: CT HEAD FINDINGS Brain: Study suffers from motion degradation. There is generalized brain atrophy. No sign of acute infarction, mass lesion, hemorrhage, hydrocephalus or extra-axial collection. Vascular: There is atherosclerotic calcification of the major vessels at the base of the brain. Skull: Negative Sinuses/Orbits: Clear/normal Other: None CT CERVICAL SPINE FINDINGS Motion degradation on this exam as well. Alignment: No traumatic malalignment. Skull base and vertebrae: No fracture or focal lesion. Soft tissues and spinal canal: Negative Disc levels: Chronic facet osteoarthritis at C2-3, C3-4, C4-5 and C7-T1. Chronic degenerative spondylosis with disc space narrowing throughout the cervical region. No apparent compressive canal stenosis. Some osteophytic foraminal narrowing from C3-4 through C7-T1, most pronounced at the C3-4 and C4-5 levels. Upper chest: Pleural and parenchymal scarring. Other: None IMPRESSION: Study suffer from motion degradation. This limits sensitivity for subtle findings. Head CT: Generalized atrophy.  No sign of focal or acute finding. Cervical spine CT: No acute or traumatic finding. Chronic degenerative spondylosis and facet arthropathy. Foraminal narrowing most pronounced at C3-4 and C4-5. Electronically Signed   By: Paulina Fusi M.D.   On: 07/06/2019 23:17   CT PELVIS WO CONTRAST  Result Date: 07/06/2019 CLINICAL DATA:  86 year old male with fall and hip pain. EXAM: CT PELVIS WITHOUT CONTRAST TECHNIQUE: Multidetector CT imaging of the pelvis was performed following the standard protocol without  intravenous contrast. COMPARISON:  Pelvic radiograph dated 07/06/2019. FINDINGS: Evaluation is limited due to streak artifact caused by metallic left hip arthroplasty. Urinary Tract: Severely distended urinary bladder concerning for bladder outlet obstruction. Clinical correlation is recommended. Consider placement of the Foley catheter if the patient is unable to void. Bowel:  No bowel dilatation in the visualized pelvis. Vascular/Lymphatic: Advanced aortoiliac atherosclerotic disease. No adenopathy noted within the pelvis. Reproductive: Mildly enlarged prostate gland measuring 4.7 cm in transverse axial diameter. Apparent post procedural changes of TURP. Other:  None Musculoskeletal: Evaluation for fracture is limited due to advanced osteopenia. No acute fracture or dislocation. Severe arthritic changes of the right hip with near complete joint space loss and bone-on-bone contact. IMPRESSION: 1. No acute fracture or dislocation. 2. Osteopenia and severe osteoarthritic changes of the right hip. 3. Severely distended urinary bladder concerning for bladder outlet obstruction. Clinical correlation is recommended. 4. Aortic Atherosclerosis (ICD10-I70.0). Electronically Signed   By: Elgie Collard M.D.   On: 07/06/2019 23:37    Micro Results   Recent Results (from the past 240 hour(s))  Urine Culture     Status: Abnormal   Collection Time: 07/06/19  9:20 PM   Specimen: Urine, Random  Result Value Ref Range Status   Specimen Description   Final    URINE, RANDOM Performed at [facility], [address], [place], [place] [address]    Special Requests   Final    NONE Performed at [facility], [address] [place], [place] [address]    Culture (A)  Final    >=100,000 COLONIES/mL AEROCOCCUS SPECIES Standardized susceptibility testing for this organism is not available. Performed at [facility], [address], [place], [place] [address]    Report Status 07/09/2019 FINAL   Final  SARS CORONAVIRUS 2 (TAT 6-24 HRS) Nasopharyngeal Nasopharyngeal Swab     Status: None   Collection Time: 07/06/19 11:58 PM   Specimen: Nasopharyngeal Swab  Result Value Ref Range Status   SARS Coronavirus 2 NEGATIVE NEGATIVE Final    Comment: (NOTE)  SARS-CoV-2 target nucleic acids are NOT DETECTED. The SARS-CoV-2 RNA is generally detectable in upper and lower respiratory specimens during the acute phase of infection. Negative results do not preclude SARS-CoV-2 infection, do not rule out co-infections with other pathogens, and should not be used as the sole basis for treatment or other patient management decisions. Negative results must be combined with clinical observations, patient history, and epidemiological information. The expected result is Negative. Fact Sheet for Patients: HairSlick.no Fact Sheet for Healthcare Providers: quierodirigir.com This test is not yet approved or cleared by the [place] FDA and  has been authorized for detection and/or diagnosis of SARS-CoV-2 by FDA under an Emergency Use Authorization (EUA). This EUA will remain  in effect (meaning this test can be used) for the duration of the COVID-19 declaration under Section 56 4(b)(1) of the Act, 21 U.S.C. section 360bbb-3(b)(1), unless the authorization is terminated or revoked sooner. Performed at [facility], [address], [place], [place] [address]     Today   Subjective    Victor Jones today has no complaints.  He is awake and alert and friendly but clearly has dementia.  Patient's wife is at bedside and states he is back to his baseline.  She is eager to take him home.  Objective   Blood pressure 138/77, pulse 75, temperature 98.2 F (36.8 C), temperature source Oral, resp. rate 17, height 5\' 11"  (1.803 m), weight 60.2 kg, SpO2 96 %.   Intake/Output Summary (Last 24 hours) at 07/10/2019 1423 Last data filed at 07/10/2019  1359 Gross per 24 hour  Intake 360 ml  Output 1310 ml  Net -950 ml    Exam Awake Alert, No new F.N deficits, Normal affect [place].AT,PERRAL Supple Neck,No JVD, No cervical lymphadenopathy appriciated.  Symmetrical Chest wall movement, Good air movement bilaterally, CTAB RRR,No Gallops,Rubs or new Murmurs, No Parasternal Heave +ve B.Sounds, Abd Soft, Non tender, No organomegaly appriciated, No rebound -guarding or rigidity. No Cyanosis, Clubbing or edema, No new Rash or bruise   Data Review   CBC w Diff:  Lab Results  Component Value Date   WBC 9.8 07/10/2019   HGB 10.6 (L) 07/10/2019   HCT 30.9 (L) 07/10/2019   PLT 168 07/10/2019    CMP:  Lab Results  Component Value Date   NA 139 07/10/2019   K 3.4 (L) 07/10/2019   CL 107 07/10/2019   CO2 23 07/10/2019   BUN 12 07/10/2019   CREATININE 0.71 07/10/2019   PROT 6.8 07/06/2019   ALBUMIN 4.1 07/06/2019   BILITOT 1.6 (H) 07/06/2019   ALKPHOS 94 07/06/2019   AST 31 07/06/2019   ALT 24 07/06/2019  .   Total Time in preparing paper work, data evaluation and todays exam - 35 minutes  07/08/2019 M.D on 07/10/2019 at 2:23 PM  [facility]   Office  [phone]

## 2019-07-11 NOTE — TOC Progression Note (Signed)
Transition of Care [facility]) - Progression Note    Patient Details  Name: Victor Jones MRN: [id number] Date of Birth: [birth year]  Transition of [facility]) CM/SW Contact  Barrie Dunker, RN Phone Number: 07/11/2019, 1:50 PM  Clinical Narrative:    Faxed HH orders and supplies to the [place] at [phone] attn Lilia Pro RN and Dr Judie Grieve   Expected Discharge Plan: Home w Home Health Services Barriers to Discharge: Continued Medical Work up  Expected Discharge Plan and Services Expected Discharge Plan: Home w Home Health Services   Discharge Planning Services: CM Consult   Living arrangements for the past 2 months: Apartment Expected Discharge Date: 07/10/19               DME Arranged: N/A         HH Arranged: NA           Social Determinants of Health (SDOH) Interventions    Readmission Risk Interventions No flowsheet data found.

## 2019-07-11 NOTE — TOC Progression Note (Signed)
Transition of Care [facility]) - Progression Note    Patient Details  Name: Victor Jones MRN: [id number] Date of Birth: 1928/08/12  Transition of Care [facility]) CM/SW Contact  Barrie Dunker, RN Phone Number: 07/11/2019, 2:30 PM  Clinical Narrative:     Received a call from the SW at the [facility] and she requested the DC summary, I faxed a copy of the DC summary to [phone] at her request attn Dr Judie Grieve  Expected Discharge Plan: Home w Home Health Services Barriers to Discharge: Continued Medical Work up  Expected Discharge Plan and Services Expected Discharge Plan: Home w Home Health Services   Discharge Planning Services: CM Consult   Living arrangements for the past 2 months: Apartment Expected Discharge Date: 07/10/19               DME Arranged: N/A         HH Arranged: NA           Social Determinants of Health (SDOH) Interventions    Readmission Risk Interventions No flowsheet data found.

## 2019-07-15 ENCOUNTER — Telehealth: Payer: Self-pay

## 2019-07-15 ENCOUNTER — Other Ambulatory Visit: Payer: Self-pay

## 2019-07-15 ENCOUNTER — Ambulatory Visit (INDEPENDENT_AMBULATORY_CARE_PROVIDER_SITE_OTHER): Payer: PPO | Admitting: Urology

## 2019-07-15 ENCOUNTER — Ambulatory Visit: Payer: PPO | Admitting: Physician Assistant

## 2019-07-15 VITALS — BP 100/68 | HR 90

## 2019-07-15 DIAGNOSIS — R339 Retention of urine, unspecified: Secondary | ICD-10-CM | POA: Diagnosis not present

## 2019-07-15 NOTE — Progress Notes (Signed)
Fill and Pull Catheter Removal  Patient is present today for a catheter removal.  Patient was cleaned and prepped in a sterile fashion of sterile water/ saline was instilled into the bladder when the patient felt the urge to urinate. 41ml of water was then drained from the balloon.  A 14FR foley cath was removed from the bladder no complications were noted .  Patient as then given some time to void on their own.  Patient cannot void  on their own after some time.  Patient tolerated well.  Performed by: Milas Kocher, CMA and Gerarda Gunther    Follow up/ Additional notes: This afternoon with PA

## 2019-07-15 NOTE — Progress Notes (Signed)
07/15/2019 8:35 AM   Victor Jones [birth year] [id number]  Referring provider: Marina Goodell, MD [address] [place],  [place] [address]  Chief Complaint  Patient presents with  . Urinary Retention    HPI: Patient recently went to the emergency room.  He has dementia.  Patient was found on the CT scan not be emptying well.  He was treated for urinary tract infection and Foley catheter and had a failed trial of voiding.  Mental status improved back to baseline.  He was started on finasteride and Flomax.  The CT scan was of the pelvis and not of his kidneys.  Culture was positive  Straight obtain by his wife.  She said he has dementia and voids into 4 depends a day.  She will time void him every 2-3 hours but she does not know what his flow was like.  By history in the early 90s he had a catheter and likely a transurethral resection of the prostate possibly repeated.  He had fallen twice and taken to the emergency room but did not have other cystitis or flow symptoms based on today's history  No history of constipation   PMH: No past medical history on file.  Surgical History:   Home Medications:  Allergies as of 07/15/2019   No Known Allergies     Medication List       Accurate as of July 15, 2019  8:35 AM. If you have any questions, ask your nurse or doctor.        amoxicillin-clavulanate 875-125 MG tablet Commonly known as: AUGMENTIN Take 1 tablet by mouth every 12 (twelve) hours for 5 days.   aspirin EC 81 MG tablet Take 81 mg by mouth daily.   finasteride 5 MG tablet Commonly known as: Proscar Take 1 tablet (5 mg total) by mouth daily.   furosemide 20 MG tablet Commonly known as: LASIX Take 20 mg by mouth daily.   Multi-Vitamin tablet Take by mouth.   simvastatin 20 MG tablet Commonly known as: ZOCOR Take 20 mg by mouth at bedtime.   tamsulosin 0.4 MG Caps capsule Commonly known as: FLOMAX Take 1 capsule (0.4 mg total) by mouth daily.    torsemide 10 MG tablet Commonly known as: DEMADEX Take 10 mg by mouth daily.   traMADol 50 MG tablet Commonly known as: ULTRAM       Allergies: No Known Allergies  Family History: No family history on file.  Social History:  has no history on file for tobacco, alcohol, and drug.  ROS:                                        Physical Exam: BP 100/68   Pulse 90   Constitutional:  Alert and oriented, No acute distress. HEENT: [place] AT, moist mucus membranes.  Trachea midline, no masses. Cardiovascular: No clubbing, cyanosis, or edema. Respiratory: Normal respiratory effort, no increased work of breathing. GI: Abdomen is soft, nontender, nondistended, no abdominal masses GU: No CVA tenderness.  Foley catheter in place Skin: No rashes, bruises or suspicious lesions. Lymph: No cervical or inguinal adenopathy. Neurologic: Grossly intact, no focal deficits, moving all 4 extremities. Psychiatric: Normal mood and affect.  Laboratory Data: Lab Results  Component Value Date   WBC 9.8 07/10/2019   HGB 10.6 (L) 07/10/2019   HCT 30.9 (L) 07/10/2019   MCV 92.8 07/10/2019  PLT 168 07/10/2019    Lab Results  Component Value Date   CREATININE 0.71 07/10/2019    No results found for: PSA  No results found for: TESTOSTERONE  No results found for: HGBA1C  Urinalysis    Component Value Date/Time   COLORURINE YELLOW (A) 07/06/2019 2120   APPEARANCEUR HAZY (A) 07/06/2019 2120   LABSPEC 1.009 07/06/2019 2120   PHURINE 6.0 07/06/2019 2120   GLUCOSEU NEGATIVE 07/06/2019 2120   HGBUR NEGATIVE 07/06/2019 2120   [place] NEGATIVE 07/06/2019 2120   [place] NEGATIVE 07/06/2019 2120   PROTEINUR NEGATIVE 07/06/2019 2120   NITRITE NEGATIVE 07/06/2019 2120   LEUKOCYTESUR MODERATE (A) 07/06/2019 2120    Pertinent Imaging: Reviewed  Assessment & Plan: Patient will have trial of voiding on alpha-blocker and finasteride.  I think is reasonable to keep him  on these medication.  See nurse practitioner today.  If he is comfortable and his residual is only moderately elevated we may leave as is and check another one tomorrow.  He may have had a chronic elevated residual.   There are no diagnoses linked to this encounter.  No follow-ups on file.  Reece Packer, MD  [facility] [address], [place], [place] [address] [phone]

## 2019-07-15 NOTE — Telephone Encounter (Signed)
Incoming call from pt's family, stating that pt is unwilling to come in for his afternoon appointment. Pt is adamant that he does not need to come in, family notes that pt has dementia and is often combative in the afternoons, they feel that pt may be more amendable in the morning. Pt rescheduled to 07/16/19 10:00am.   Family states that pt has voided at least once that they are aware of. Pt has drank 2 bottles of water and a beer. Informed family of the need to seek care in the ED if pt is unable to void overnight.

## 2019-07-16 ENCOUNTER — Encounter: Payer: Self-pay | Admitting: Physician Assistant

## 2019-07-16 ENCOUNTER — Ambulatory Visit (INDEPENDENT_AMBULATORY_CARE_PROVIDER_SITE_OTHER): Payer: PPO | Admitting: Physician Assistant

## 2019-07-16 VITALS — BP 82/56 | HR 98 | Ht 71.0 in | Wt 132.0 lb

## 2019-07-16 DIAGNOSIS — R339 Retention of urine, unspecified: Secondary | ICD-10-CM | POA: Diagnosis not present

## 2019-07-16 DIAGNOSIS — R338 Other retention of urine: Secondary | ICD-10-CM | POA: Diagnosis not present

## 2019-07-16 DIAGNOSIS — F0391 Unspecified dementia with behavioral disturbance: Secondary | ICD-10-CM

## 2019-07-16 LAB — BLADDER SCAN AMB NON-IMAGING: Scan Result: >999

## 2019-07-16 NOTE — Progress Notes (Signed)
Simple Catheter Placement  Due to urinary retention patient is present today for a foley cath placement.  Foreskin was retracted, patient was cleaned and prepped in a sterile fashion with betadine, and 2% lidocaine jelly was instilled into the urethra. A 16 FR coud foley catheter was inserted, urine return was noted  , urine was yellow in color.  The balloon was filled with 10cc of sterile water and the foreskin was reduced.  A leg bag was attached for drainage to his nondominant right leg and a 14Fr red rubber catheter was taped to his dominant left leg as a decoy to discourage catheter pulling. Patient declined a night bag. Patient was given instruction on proper catheter care.  Patient tolerated well, no complications were noted   Performed by: Carman Ching, PA-C   Additional notes/ Follow up: Increase Flomax to 0.4mg  BID and RTC in about 10 days for repeat voiding trial (subsequent mornings).

## 2019-07-16 NOTE — Progress Notes (Signed)
07/16/2019 3:17 PM   Vernie Ammons 05-Feb-1929 [id number]  CC: Voiding trial follow-up with PVR  HPI: Victor Jones is a 86 y.o. male with PMH dementia and CAD s/p stent angioplasty who presents today for follow-up of voiding trial with PVR.  He originally presented to the ED on 07/06/2019 after a fall with acute worsening of mental status. Trauma workup revealed a severely distended urinary bladder. Admission labs notable for creatinine 1.30 and UA with pyuria and WBC clumps. Urine culture ultimately resulted with Aerococcus spp, no susceptibilities available.  A foley catheter was placed during admission and he was treated with ceftriaxone and Augmentin for management of UTI. He was also started on Flomax and finasteride. A voiding trial was attempted on 07/08/2019 during admission; patient failed this and catheter was replaced.  He was discharged on 07/10/2019 with creatinine normalized to 0.71 and followed up with Dr. Sherron Monday in clinic yesterday for repeat voiding trial and evaluation. Foley was removed during that visit with plans for afternoon PVR following, however patient's family contacted the office to report that he was agitated and unable to return as planned. He presents today to follow up.  He is accompanied today by his wife, who provides the majority of HPI.  Today, he reports no abdominal discomfort.  He states he has been unable to urinate since his Foley catheter was removed yesterday, however his wife states he may have urinated twice.  Patient's wife reports he has been taking Flomax and finasteride daily as prescribed.  He appears to be tolerating these medications well.  He does not appear to be more dizzy or unsteady on his feet than is typical for him.  Patient's wife reports a remote history of an unknown prostate and bladder procedure in the early 1990s.  She states this was performed for management of urinary difficulties.  Patient reports that he has tolerated his  Foley catheter well.  Patient's wife reports that he has attempted to tug on the catheter every time he bathes, however he has not been able to dislodge it and he has been relatively redirectable each time this has been witnessed.  Of note, patient is left-handed and his leg bag was on his left leg.  PMH: History reviewed. No pertinent past medical history.  Surgical History: History reviewed. No pertinent surgical history.  Home Medications:  Allergies as of 07/16/2019   No Known Allergies     Medication List       Accurate as of July 16, 2019  3:17 PM. If you have any questions, ask your nurse or doctor.        aspirin EC 81 MG tablet Take 81 mg by mouth daily.   finasteride 5 MG tablet Commonly known as: Proscar Take 1 tablet (5 mg total) by mouth daily.   furosemide 20 MG tablet Commonly known as: LASIX Take 20 mg by mouth daily.   Multi-Vitamin tablet Take by mouth.   simvastatin 20 MG tablet Commonly known as: ZOCOR Take 20 mg by mouth at bedtime.   tamsulosin 0.4 MG Caps capsule Commonly known as: FLOMAX Take 1 capsule (0.4 mg total) by mouth daily.   torsemide 10 MG tablet Commonly known as: DEMADEX Take 10 mg by mouth daily.   traMADol 50 MG tablet Commonly known as: ULTRAM       Allergies:  No Known Allergies  Family History: History reviewed. No pertinent family history.  Social History:   has no history on file for tobacco, alcohol, and  drug.  Physical Exam: BP (!) 82/56   Pulse 98   Ht 5\' 11"  (1.803 m)   Wt 132 lb (59.9 kg)   BMI 18.41 kg/m   Constitutional:  Alert, no acute distress, nontoxic appearing HEENT: [place], AT Cardiovascular: No clubbing, cyanosis, or edema Respiratory: Normal respiratory effort, no increased work of breathing GU: Palpable distended urinary bladder above the umbilicus.  Uncircumcised penis.  No suprapubic tenderness. Skin: No rashes, bruises or suspicious lesions Neurologic: Grossly intact, no focal deficits,  moving all 4 extremities Psychiatric: Verbal responses limited to "yes" and "no," responding to questions.  Laboratory Data: Results for orders placed or performed in visit on 07/16/19  Bladder Scan (Post Void Residual) in office  Result Value Ref Range   Scan Result >999    Pertinent Imaging: CT pelvis without contrast, 07/06/2019: CLINICAL DATA:  86 year old male with fall and hip pain.  EXAM: CT PELVIS WITHOUT CONTRAST  TECHNIQUE: Multidetector CT imaging of the pelvis was performed following the standard protocol without intravenous contrast.  COMPARISON:  Pelvic radiograph dated 07/06/2019.  FINDINGS: Evaluation is limited due to streak artifact caused by metallic left hip arthroplasty.  Urinary Tract: Severely distended urinary bladder concerning for bladder outlet obstruction. Clinical correlation is recommended. Consider placement of the Foley catheter if the patient is unable to void.  Bowel:  No bowel dilatation in the visualized pelvis.  Vascular/Lymphatic: Advanced aortoiliac atherosclerotic disease. No adenopathy noted within the pelvis.  Reproductive: Mildly enlarged prostate gland measuring 4.7 cm in transverse axial diameter. Apparent post procedural changes of TURP.  Other:  None  Musculoskeletal: Evaluation for fracture is limited due to advanced osteopenia. No acute fracture or dislocation. Severe arthritic changes of the right hip with near complete joint space loss and bone-on-bone contact.  IMPRESSION: 1. No acute fracture or dislocation. 2. Osteopenia and severe osteoarthritic changes of the right hip. 3. Severely distended urinary bladder concerning for bladder outlet obstruction. Clinical correlation is recommended. 4. Aortic Atherosclerosis (ICD10-I70.0).   Electronically Signed   By: 07/08/2019 M.D.   On: 07/06/2019 23:37  I personally reviewed the images referenced above and note a severely distended urinary  bladder without visualized kidneys to evaluate for hydronephrosis.  Assessment & Plan:   1. Acute urinary retention 86 year old male with a recent history of UTI, AKI, and acute urinary retention presents today for follow-up of his second voiding trial (first as an outpatient).  PVR significantly elevated, patient denies abdominal pain.  It is unclear if the patient is truly asymptomatic of his retention today or if his dementia limits his awareness.  Regardless, I suspect his recent presentation reflects an acute-on-chronic process due to his recent AKI which resolved with bladder decompression as well as the degree of bladder distention noted on imaging in the absence of overt pain symptoms.  I had a lengthy conversation with the patient and his wife today.  I explained that I recommend replacement of his Foley catheter at this time given an elevated PVR.  I recommend proceeding with another outpatient voiding trial in 7 to 10 days versus plans for chronic Foley catheterization.  Patient's wife would like to reattempt a voiding trial in 7 to 10 days.  I am in agreement with this plan.  Foley catheter replaced today, see separate procedure note for details.  I was ultimately able to drain approximately 98 of urine from the patient's bladder today.  I am concerned about his ability to tolerate chronic Foley catheterization due to his dementia,  however I am also concerned about the risk of repeat urinary retention with upstream hydronephrosis and possible renal failure as long-term consequences.  I have counseled the patient to increase his Flomax to 0.4 mg twice daily to increase his chances of passing his next voiding trial.  I counseled his wife to decrease his dose back to 0.4 mg daily if he appears dizzy or more unsteady on his feet at this increased dose.  She expressed understanding. - Bladder Scan (Post Void Residual) in office   2. Dementia with behavioral disturbance, unspecified dementia  type ([place]) Foley catheter placement today complicated by patient's dementia.  He attempted to interfere with Foley catheter placement several times and was largely redirectable, however I am concerned that he may become increasingly agitated with catheter placements in the future. I recommend only morning office visits moving forward to avoid afternoon agitation.   Return in about 10 days (around 07/26/2019) for Repeat voiding trial (subsequent mornings).   I spent 50 minutes on the day of the encounter to include pre-visit record review, face-to-face time with the patient, and post-visit ordering of tests.   Debroah Loop, PA-C  [facility] [address], [place], [place] [address] [phone]

## 2019-07-19 ENCOUNTER — Emergency Department
Admission: EM | Admit: 2019-07-19 | Discharge: 2019-07-19 | Disposition: A | Discharge status: Home / Self Care | Payer: No Typology Code available for payment source | Attending: Emergency Medicine | Admitting: Emergency Medicine

## 2019-07-19 ENCOUNTER — Emergency Department: Payer: No Typology Code available for payment source

## 2019-07-19 ENCOUNTER — Other Ambulatory Visit: Payer: Self-pay

## 2019-07-19 DIAGNOSIS — F0390 Unspecified dementia without behavioral disturbance: Secondary | ICD-10-CM | POA: Insufficient documentation

## 2019-07-19 DIAGNOSIS — Z7982 Long term (current) use of aspirin: Secondary | ICD-10-CM | POA: Insufficient documentation

## 2019-07-19 DIAGNOSIS — K5900 Constipation, unspecified: Secondary | ICD-10-CM | POA: Diagnosis not present

## 2019-07-19 DIAGNOSIS — R69 Illness, unspecified: Secondary | ICD-10-CM | POA: Diagnosis not present

## 2019-07-19 DIAGNOSIS — K5901 Slow transit constipation: Secondary | ICD-10-CM | POA: Insufficient documentation

## 2019-07-19 DIAGNOSIS — I2510 Atherosclerotic heart disease of native coronary artery without angina pectoris: Secondary | ICD-10-CM | POA: Insufficient documentation

## 2019-07-19 DIAGNOSIS — Z79899 Other long term (current) drug therapy: Secondary | ICD-10-CM | POA: Diagnosis not present

## 2019-07-19 DIAGNOSIS — R109 Unspecified abdominal pain: Secondary | ICD-10-CM | POA: Diagnosis not present

## 2019-07-19 MED ORDER — DOCUSATE SODIUM 50 MG/5ML PO LIQD
100.0000 mg | Freq: Once | ORAL | Status: AC
  Administered 2019-07-19: 100 mg via ORAL
  Filled 2019-07-19: qty 10

## 2019-07-19 MED ORDER — DOCUSATE SODIUM 60 MG/15ML PO SYRP: 60.0000 mg | ORAL_SOLUTION | Freq: Every day | ORAL | 0 refills | Status: DC

## 2019-07-19 MED ORDER — MAGNESIUM CITRATE PO SOLN
1.0000 | Freq: Once | ORAL | Status: AC
  Administered 2019-07-19: 1 via ORAL
  Filled 2019-07-19: qty 296

## 2019-07-19 NOTE — ED Provider Notes (Signed)
[facility] Emergency Department Provider Note   ____________________________________________   First MD Initiated Contact with Patient 07/19/19 1331     (approximate)  I have reviewed the triage vital signs and the nursing notes.   HISTORY  Chief Complaint Constipation    HPI Victor Jones is a 86 y.o. male patient arrived via EMS with complaint of constipation.  Per wife and patient he has not had a bowel movement in 2 days.  Wife is historian secondary to patient's dementia.  Patient denies abdominal pain.         No past medical history on file.  Patient Active Problem List   Diagnosis Date Noted  . Urinary tract infection 07/07/2019  . Acute urinary retention 07/07/2019  . Fall at home, initial encounter 07/07/2019  . Acute metabolic encephalopathy 66/10/3014  . Dementia with behavioral disturbance ([place]) 07/07/2019  . CAD (coronary artery disease) 07/07/2019  . AKI (acute kidney injury) ([place]) 07/07/2019  . UTI (urinary tract infection) 07/07/2019    No past surgical history on file.  Prior to Admission medications   Medication Sig Start Date End Date Taking? Authorizing Provider  aspirin EC 81 MG tablet Take 81 mg by mouth daily.    [provider]  docusate (COLACE) 60 MG/15ML syrup Take 15 mLs (60 mg total) by mouth daily. 07/19/19   Sable Feil, PA-C  finasteride (PROSCAR) 5 MG tablet Take 1 tablet (5 mg total) by mouth daily. 07/10/19 07/09/20  Vashti Hey, MD  furosemide (LASIX) 20 MG tablet Take 20 mg by mouth daily. 05/28/19   [provider]  Multiple Vitamin (MULTI-VITAMIN) tablet Take by mouth.    [provider]  simvastatin (ZOCOR) 20 MG tablet Take 20 mg by mouth at bedtime. 04/30/19   [provider]  tamsulosin (FLOMAX) 0.4 MG CAPS capsule Take 1 capsule (0.4 mg total) by mouth daily. 07/11/19   Vashti Hey, MD  torsemide (DEMADEX) 10 MG tablet Take 10 mg by mouth  daily. 06/05/19   [provider]  traMADol Veatrice Bourbon) 50 MG tablet  05/28/19   [provider]    Allergies Patient has no known allergies.  No family history on file.  Social History Social History   Tobacco Use  . Smoking status: Not on file  Substance Use Topics  . Alcohol use: Not on file  . Drug use: Not on file    Review of Systems Constitutional: No fever/chills Eyes: No visual changes. ENT: No sore throat. Cardiovascular: Denies chest pain. Respiratory: Denies shortness of breath. Gastrointestinal: No abdominal pain.  No nausea, no vomiting.  No diarrhea.  No bowel movement for 2 days. Genitourinary: Negative for dysuria. Musculoskeletal: Negative for back pain. Skin: Negative for rash. Neurological: Negative for headaches, focal weakness or numbness. Psychiatric:  Dementia  ____________________________________________   PHYSICAL EXAM:  VITAL SIGNS: ED Triage Vitals  Enc Vitals Group     BP 07/19/19 1254 118/70     Pulse Rate 07/19/19 1254 86     Resp 07/19/19 1254 18     Temp 07/19/19 1254 97.6 F (36.4 C)     Temp Source 07/19/19 1254 Oral     SpO2 07/19/19 1254 97 %     Weight 07/19/19 1255 132 lb (59.9 kg)     Height 07/19/19 1255 5\' 11"  (1.803 m)     Head Circumference --      Peak Flow --      Pain Score 07/19/19 1254  0     Pain Loc --      Pain Edu? --      Excl. in GC? --    Constitutional: Alert and oriented. Well appearing and in no acute distress. Cardiovascular: Normal rate, regular rhythm. Grossly normal heart sounds.  Good peripheral circulation. Respiratory: Normal respiratory effort.  No retractions. Lungs CTAB. Gastrointestinal: Patient is normoactive bowel sounds.  Soft and nontender. No distention. No abdominal bruits. No CVA tenderness. Genitourinary: Deferred Musculoskeletal: No lower extremity tenderness nor edema.  No joint effusions. Neurologic:  Normal speech and language. No gross focal neurologic deficits  are appreciated. No gait instability. Skin:  Skin is warm, dry and intact. No rash noted. Psychiatric: Mood and affect are normal. Speech and behavior are normal.  ____________________________________________   LABS (all labs ordered are listed, but only abnormal results are displayed)  Labs Reviewed - No data to display ____________________________________________  EKG   ____________________________________________  RADIOLOGY  ED MD interpretation:    Official radiology report(s): DG Abdomen 1 View  Result Date: 07/19/2019 CLINICAL DATA:  Abdominal pain and constipation. EXAM: ABDOMEN - 1 VIEW COMPARISON:  None. FINDINGS: No dilated bowel loops are noted. A small to moderate amount of stool within the colon and a moderate amount of stool within the rectum is noted. Severe degenerative changes in the RIGHT hip are noted. LEFT hip arthroplasty changes are present. IMPRESSION: 1. No evidence of acute abnormality. 2. Moderate amount of rectal stool. Electronically Signed   By: Harmon Pier M.D.   On: 07/19/2019 14:28    ____________________________________________   PROCEDURES  Procedure(s) performed (including Critical Care):  Procedures   ____________________________________________   INITIAL IMPRESSION / ASSESSMENT AND PLAN / ED COURSE  As part of my medical decision making, I reviewed the following data within the electronic MEDICAL RECORD NUMBER     Patient presents with no bowel movement for 2 days.  Patient physical exam reveals no abdominal distention with normoactive bowel sounds.  Discussed KUB findings with wife.  Patient's wife given discharge care instructions.  Patient given Colace and half a bottle citrate magnesium prior to departure.  Advised to follow-up PCP.  Return right ED if condition worsens.    Victor Jones was evaluated in Emergency Department on 07/19/2019 for the symptoms described in the history of present illness. He was evaluated in the context of  the global COVID-19 pandemic, which necessitated consideration that the patient might be at risk for infection with the SARS-CoV-2 virus that causes COVID-19. Institutional protocols and algorithms that pertain to the evaluation of patients at risk for COVID-19 are in a state of rapid change based on information released by regulatory bodies including the CDC and federal and state organizations. These policies and algorithms were followed during the patient's care in the ED.       ____________________________________________   FINAL CLINICAL IMPRESSION(S) / ED DIAGNOSES  Final diagnoses:  Constipation by delayed colonic transit     ED Discharge Orders         Ordered    docusate (COLACE) 60 MG/15ML syrup  Daily     07/19/19 1530           Note:  This document was prepared using Dragon voice recognition software and may include unintentional dictation errors.    Joni Reining, PA-C 07/19/19 1536    Concha Se, MD 07/20/19 [phone]

## 2019-07-19 NOTE — ED Triage Notes (Signed)
Ems reports that the patient's wife called ems today due to patient not having a bm in 2 days, wife will not be coming in with patient as she doesn't drive. Pt is seated in w/c and in no distress at this time, pt has a hx of dementia, pt denies pain at this time

## 2019-07-19 NOTE — Discharge Instructions (Addendum)
Your x-ray did not show any intestinal obstruction.  Follow discharge care instruction continue previous medications.  Take stool softener for the next 2 to 3 days to produce bowel movement.

## 2019-07-19 NOTE — ED Notes (Signed)
Pt arrives via ems from home, unable to have a BM in 2 days Hx of frequent uti's and dementia, wife called ems, wife doesn't drive bp 384/53, 646% ra, 65 pulse, denies pain for ems

## 2019-07-19 NOTE — ED Notes (Signed)
See triage note   Wife states the PT encouraged her to bring him ED d/t low pulse rate  On arrival hr is 86   Also he has not had a BM for 2 days   Pt denies any pain or n/v

## 2019-07-25 ENCOUNTER — Ambulatory Visit (INDEPENDENT_AMBULATORY_CARE_PROVIDER_SITE_OTHER): Payer: PPO | Admitting: Physician Assistant

## 2019-07-25 ENCOUNTER — Inpatient Hospital Stay
Admission: EM | Admit: 2019-07-25 | Discharge: 2019-07-30 | DRG: 690 | Disposition: A | Discharge status: Skilled Nursing Facility | Payer: No Typology Code available for payment source | Attending: Internal Medicine | Admitting: Internal Medicine

## 2019-07-25 ENCOUNTER — Emergency Department: Payer: No Typology Code available for payment source

## 2019-07-25 ENCOUNTER — Other Ambulatory Visit: Payer: Self-pay

## 2019-07-25 ENCOUNTER — Encounter: Payer: Self-pay | Admitting: Physician Assistant

## 2019-07-25 VITALS — BP 110/68 | HR 54

## 2019-07-25 DIAGNOSIS — R279 Unspecified lack of coordination: Secondary | ICD-10-CM | POA: Diagnosis not present

## 2019-07-25 DIAGNOSIS — Z955 Presence of coronary angioplasty implant and graft: Secondary | ICD-10-CM

## 2019-07-25 DIAGNOSIS — Z87440 Personal history of urinary (tract) infections: Secondary | ICD-10-CM

## 2019-07-25 DIAGNOSIS — B952 Enterococcus as the cause of diseases classified elsewhere: Secondary | ICD-10-CM | POA: Diagnosis present

## 2019-07-25 DIAGNOSIS — W19XXXA Unspecified fall, initial encounter: Secondary | ICD-10-CM

## 2019-07-25 DIAGNOSIS — Z20822 Contact with and (suspected) exposure to covid-19: Secondary | ICD-10-CM | POA: Diagnosis present

## 2019-07-25 DIAGNOSIS — I2510 Atherosclerotic heart disease of native coronary artery without angina pectoris: Secondary | ICD-10-CM | POA: Diagnosis present

## 2019-07-25 DIAGNOSIS — R0902 Hypoxemia: Secondary | ICD-10-CM | POA: Diagnosis not present

## 2019-07-25 DIAGNOSIS — Z743 Need for continuous supervision: Secondary | ICD-10-CM | POA: Diagnosis not present

## 2019-07-25 DIAGNOSIS — Z7982 Long term (current) use of aspirin: Secondary | ICD-10-CM

## 2019-07-25 DIAGNOSIS — R410 Disorientation, unspecified: Secondary | ICD-10-CM | POA: Diagnosis not present

## 2019-07-25 DIAGNOSIS — N3090 Cystitis, unspecified without hematuria: Secondary | ICD-10-CM | POA: Diagnosis not present

## 2019-07-25 DIAGNOSIS — N390 Urinary tract infection, site not specified: Secondary | ICD-10-CM | POA: Diagnosis present

## 2019-07-25 DIAGNOSIS — R5381 Other malaise: Secondary | ICD-10-CM | POA: Diagnosis not present

## 2019-07-25 DIAGNOSIS — R338 Other retention of urine: Secondary | ICD-10-CM | POA: Diagnosis present

## 2019-07-25 DIAGNOSIS — F03918 Unspecified dementia, unspecified severity, with other behavioral disturbance: Secondary | ICD-10-CM | POA: Diagnosis present

## 2019-07-25 DIAGNOSIS — R339 Retention of urine, unspecified: Secondary | ICD-10-CM | POA: Diagnosis not present

## 2019-07-25 DIAGNOSIS — F0391 Unspecified dementia with behavioral disturbance: Secondary | ICD-10-CM | POA: Diagnosis present

## 2019-07-25 HISTORY — DX: Unspecified dementia, unspecified severity, without behavioral disturbance, psychotic disturbance, mood disturbance, and anxiety: F03.90

## 2019-07-25 LAB — CBC WITH DIFFERENTIAL/PLATELET
Abs Immature Granulocytes: 0.13 10*3/uL — ABNORMAL HIGH (ref 0.00–0.07)
Basophils Absolute: 0.1 10*3/uL (ref 0.0–0.1)
Basophils Relative: 0 %
Eosinophils Absolute: 0.1 10*3/uL (ref 0.0–0.5)
Eosinophils Relative: 0 %
HCT: 35.2 % — ABNORMAL LOW (ref 39.0–52.0)
Hemoglobin: 11.7 g/dL — ABNORMAL LOW (ref 13.0–17.0)
Immature Granulocytes: 1 %
Lymphocytes Relative: 29 %
Lymphs Abs: 4.9 10*3/uL — ABNORMAL HIGH (ref 0.7–4.0)
MCH: 31.9 pg (ref 26.0–34.0)
MCHC: 33.2 g/dL (ref 30.0–36.0)
MCV: 95.9 fL (ref 80.0–100.0)
Monocytes Absolute: 1.5 10*3/uL — ABNORMAL HIGH (ref 0.1–1.0)
Monocytes Relative: 9 %
Neutro Abs: 10.2 10*3/uL — ABNORMAL HIGH (ref 1.7–7.7)
Neutrophils Relative %: 61 %
Platelets: 248 10*3/uL (ref 150–400)
RBC: 3.67 MIL/uL — ABNORMAL LOW (ref 4.22–5.81)
RDW: 13.5 % (ref 11.5–15.5)
WBC: 16.8 10*3/uL — ABNORMAL HIGH (ref 4.0–10.5)
nRBC: 0 % (ref 0.0–0.2)

## 2019-07-25 LAB — URINALYSIS, ROUTINE W REFLEX MICROSCOPIC
Bilirubin Urine: NEGATIVE
Glucose, UA: NEGATIVE mg/dL
Hgb urine dipstick: NEGATIVE
Ketones, ur: NEGATIVE mg/dL
Nitrite: NEGATIVE
Protein, ur: 100 mg/dL — AB
Specific Gravity, Urine: 1.02 (ref 1.005–1.030)
WBC, UA: >50 WBC/hpf — ABNORMAL HIGH (ref 0–5)
pH: 6 (ref 5.0–8.0)

## 2019-07-25 LAB — BASIC METABOLIC PANEL
Anion gap: 8 (ref 5–15)
BUN: 30 mg/dL — ABNORMAL HIGH (ref 8–23)
CO2: 23 mmol/L (ref 22–32)
Calcium: 9.1 mg/dL (ref 8.9–10.3)
Chloride: 103 mmol/L (ref 98–111)
Creatinine, Ser: 0.88 mg/dL (ref 0.61–1.24)
GFR calc Af Amer: >60 mL/min (ref >60)
GFR calc non Af Amer: >60 mL/min (ref >60)
Glucose, Bld: 118 mg/dL — ABNORMAL HIGH (ref 70–99)
Potassium: 4.8 mmol/L (ref 3.5–5.1)
Sodium: 134 mmol/L — ABNORMAL LOW (ref 135–145)

## 2019-07-25 LAB — RESPIRATORY PANEL BY RT PCR (FLU A&B, COVID)
Influenza A by PCR: NEGATIVE
Influenza B by PCR: NEGATIVE
SARS Coronavirus 2 by RT PCR: NEGATIVE

## 2019-07-25 LAB — MAGNESIUM: Magnesium: 2 mg/dL (ref 1.7–2.4)

## 2019-07-25 MED ORDER — SODIUM CHLORIDE 0.9 % IV SOLN
1.0000 g | Freq: Once | INTRAVENOUS | Status: AC
  Administered 2019-07-26: 1 g via INTRAVENOUS
  Filled 2019-07-25: qty 10

## 2019-07-25 NOTE — ED Notes (Signed)
Verbal hold on bladder scan per EDP [place]; verbal to place coude cath.

## 2019-07-25 NOTE — ED Notes (Signed)
Pt remains off unit at imaging. Will collect all bloodwork once pt back.

## 2019-07-25 NOTE — ED Provider Notes (Signed)
[facility] Emergency Department Provider Note  ____________________________________________   First MD Initiated Contact with Patient 07/25/19 1918     (approximate)  I have reviewed the triage vital signs and the nursing notes.   HISTORY  Chief Complaint Urinary Retention    HPI Victor Jones is a 86 y.o. male with history of UTIs and urinary retention and dementia who comes in with concerns for retention.  Patient was seen by urology today and had a 75 French coud Foley catheter that was removed from the bladder.  Patient was placed on Flomax 0.8 daily.  Patient was seen by PA Vaillancourt..  Catheter was moved at 10 AM.  Family returns with patient stating that he has not urinated since then.  Patient's daughter is at bedside.  She states that she has not seen him for 4 months but he seems to be more agitated and confused than his normal.  She states that she is not sure if he is had any fall because he is not a great historian.  She states that his wife was the one who was previously taking care of him but she recently was sick and has a pacemaker that was just put into now she is going to a rehab facility.  Patient's daughter is coming from out of state and states that there is nobody to help take care of him anymore and that he would require placement into rehab facility if there is nothing acutely wrong with him for patient to be admitted today.  Patient himself is oriented x1 but denies any pain upon palpation but I am unable to get a full HPI due to patient's dementia.          Past Medical History:  Diagnosis Date  . [facility])     Patient Active Problem List   Diagnosis Date Noted  . Urinary tract infection 07/07/2019  . Acute urinary retention 07/07/2019  . Fall at home, initial encounter 07/07/2019  . Acute metabolic encephalopathy 07/07/2019  . Dementia with behavioral disturbance (HCC) 07/07/2019  . CAD (coronary artery disease)  07/07/2019  . AKI (acute kidney injury) (HCC) 07/07/2019  . UTI (urinary tract infection) 07/07/2019    No past surgical history on file.  Prior to Admission medications   Medication Sig Start Date End Date Taking? Authorizing Provider  aspirin EC 81 MG tablet Take 81 mg by mouth daily.    [provider]  docusate (COLACE) 60 MG/15ML syrup Take 15 mLs (60 mg total) by mouth daily. 07/19/19   Joni Reining, PA-C  finasteride (PROSCAR) 5 MG tablet Take 1 tablet (5 mg total) by mouth daily. 07/10/19 07/09/20  Pieter Partridge, MD  furosemide (LASIX) 20 MG tablet Take 20 mg by mouth daily. 05/28/19   [provider]  Multiple Vitamin (MULTI-VITAMIN) tablet Take by mouth.    [provider]  simvastatin (ZOCOR) 20 MG tablet Take 20 mg by mouth at bedtime. 04/30/19   [provider]  tamsulosin (FLOMAX) 0.4 MG CAPS capsule Take 1 capsule (0.4 mg total) by mouth daily. 07/11/19   Pieter Partridge, MD  torsemide (DEMADEX) 10 MG tablet Take 10 mg by mouth daily. 06/05/19   [provider]  traMADol Janean Sark) 50 MG tablet  05/28/19   [provider]    Allergies Patient has no known allergies.  No family history on file.  Social History Social History   Tobacco Use  . Smoking status: Never Smoker  .  Smokeless tobacco: Never Used  Substance Use Topics  . Alcohol use: Not Currently  . Drug use: Not on file      Review of Systems Unable to get full HPI due to patient's dementia _________________________   PHYSICAL EXAM:  VITAL SIGNS: Blood pressure 134/81, pulse 83, temperature 98.2 F (36.8 C), temperature source Oral, height 6' (1.829 m), weight 75 kg, SpO2 100 %.  Constitutional: Alert and oriented x1. Well appearing and in no acute distress. Eyes: Conjunctivae are normal. EOMI. Head: Atraumatic. Nose: No congestion/rhinnorhea. Mouth/Throat: Mucous membranes are moist.   Neck: No stridor. Trachea Midline.  FROM Cardiovascular: Normal rate, regular rhythm. Grossly normal heart sounds.  Good peripheral circulation.  No chest wall tenderness Respiratory: Normal respiratory effort.  No retractions. Lungs CTAB. Gastrointestinal: Soft and nontender. No distention. No abdominal bruits.  Musculoskeletal: No lower extremity tenderness nor edema.  No joint effusions.  Does not seem to have any tenderness on his pelvis. Neurologic:  Normal speech and language.  Patient does not really follow commands.  Oriented x1.  Does move all extremities Skin:  Skin is warm, dry and intact. No rash noted. Psychiatric: Mood and affect are normal.  Limited due to his dementia. GU: Deferred   ____________________________________________   LABS (all labs ordered are listed, but only abnormal results are displayed)  Labs Reviewed  RESPIRATORY PANEL BY RT PCR (FLU A&B, COVID)  CBC WITH DIFFERENTIAL/PLATELET  BASIC METABOLIC PANEL  MAGNESIUM  URINALYSIS, ROUTINE W REFLEX MICROSCOPIC   ____________________________________________   RADIOLOGY   ED MD interpretation: Pending  Official radiology report(s): No results found.  ____________________________________________   PROCEDURES  Procedure(s) performed (including Critical Care):  Procedures   ____________________________________________   INITIAL IMPRESSION / ASSESSMENT AND PLAN / ED COURSE  Victor Jones was evaluated in Emergency Department on 07/25/2019 for the symptoms described in the history of present illness. He was evaluated in the context of the global COVID-19 pandemic, which necessitated consideration that the patient might be at risk for infection with the SARS-CoV-2 virus that causes COVID-19. Institutional protocols and algorithms that pertain to the evaluation of patients at risk for COVID-19 are in a state of rapid change based on information released by regulatory bodies including the CDC and federal and state organizations. These  policies and algorithms were followed during the patient's care in the ED.    Patient is an 86 year old gentleman with known urinary retention who is status post trial of removing his Foley who comes in with unable to urinate and daughter concerned that he is not able to take care of himself.  Will get labs to evaluate for electrolyte abnormalities, AKI.  Will do bladder scan and if Elevated Place, Foley and evaluate for UTI.  Given the concern that patient may have had a fall will get CT head to evaluate for intracranial hemorrhage, CT cervical evaluate for cervical fracture.  Patient does not seem to be tender anywhere else but given his limited history and the daughter stating that he will now not ambulate will get chest x-ray and pelvis x-ray low distantly seem to be tender in either the spots.   Patient handed off to oncoming team pending CTs and labs.  If work-up is negative patient will need social work consult for placement.  If patient has some that he can be admitted for when I would admit patient  ____________________________________________   FINAL CLINICAL IMPRESSION(S) / ED DIAGNOSES   Final diagnoses:  Urinary retention      MEDICATIONS  GIVEN DURING THIS VISIT:  Medications - No data to display   ED Discharge Orders    None       Note:  This document was prepared using Dragon voice recognition software and may include unintentional dictation errors.   Concha Se, MD 07/25/19 2040

## 2019-07-25 NOTE — Progress Notes (Signed)
Catheter Removal  Patient is present today for a catheter removal.  25ml of water was drained from the balloon. A 16FR coude foley cath was removed from the bladder no complications were noted . Patient tolerated well.  Performed by: Carman Ching, PA-C   Follow up/ Additional notes: RTC tomorrow for PVR to complete second outpatient voiding trial on Flomax 0.8mg  daily. Cannot be completed same-day secondary to dementia and afternoon agitation.

## 2019-07-25 NOTE — ED Triage Notes (Signed)
Pt in via EMS from home with family d/t failure to urinate after foley cath removed this morning at 10am. Per EMS wife would also like a social work consult for placement into assisted living/nursing facility. BP 145/76 and 96% RA with EMS. Pt alert and calm. History of dementia. Pt currently A&Ox1 (self only); denies pain.

## 2019-07-25 NOTE — ED Notes (Signed)
Attempted 20g at L arm; vein blew about drawing tubes of blood.

## 2019-07-25 NOTE — ED Notes (Signed)
Bed rails up. Call bell within reach. Bed locked low. Family at bedside. Nonslip socks applied to pt's feet.

## 2019-07-26 ENCOUNTER — Ambulatory Visit: Payer: PPO | Admitting: Physician Assistant

## 2019-07-26 ENCOUNTER — Encounter: Payer: Self-pay | Admitting: Internal Medicine

## 2019-07-26 DIAGNOSIS — Z955 Presence of coronary angioplasty implant and graft: Secondary | ICD-10-CM | POA: Diagnosis not present

## 2019-07-26 DIAGNOSIS — R5381 Other malaise: Secondary | ICD-10-CM | POA: Diagnosis not present

## 2019-07-26 DIAGNOSIS — M2550 Pain in unspecified joint: Secondary | ICD-10-CM | POA: Diagnosis not present

## 2019-07-26 DIAGNOSIS — R338 Other retention of urine: Secondary | ICD-10-CM | POA: Diagnosis not present

## 2019-07-26 DIAGNOSIS — B952 Enterococcus as the cause of diseases classified elsewhere: Secondary | ICD-10-CM | POA: Diagnosis present

## 2019-07-26 DIAGNOSIS — Z20822 Contact with and (suspected) exposure to covid-19: Secondary | ICD-10-CM | POA: Diagnosis present

## 2019-07-26 DIAGNOSIS — Z7982 Long term (current) use of aspirin: Secondary | ICD-10-CM | POA: Diagnosis not present

## 2019-07-26 DIAGNOSIS — M1612 Unilateral primary osteoarthritis, left hip: Secondary | ICD-10-CM | POA: Diagnosis not present

## 2019-07-26 DIAGNOSIS — Z87440 Personal history of urinary (tract) infections: Secondary | ICD-10-CM | POA: Diagnosis not present

## 2019-07-26 DIAGNOSIS — F0391 Unspecified dementia with behavioral disturbance: Secondary | ICD-10-CM | POA: Diagnosis not present

## 2019-07-26 DIAGNOSIS — I2510 Atherosclerotic heart disease of native coronary artery without angina pectoris: Secondary | ICD-10-CM | POA: Diagnosis not present

## 2019-07-26 DIAGNOSIS — N3090 Cystitis, unspecified without hematuria: Secondary | ICD-10-CM | POA: Diagnosis present

## 2019-07-26 DIAGNOSIS — B965 Pseudomonas (aeruginosa) (mallei) (pseudomallei) as the cause of diseases classified elsewhere: Secondary | ICD-10-CM | POA: Diagnosis not present

## 2019-07-26 DIAGNOSIS — R339 Retention of urine, unspecified: Secondary | ICD-10-CM | POA: Diagnosis present

## 2019-07-26 DIAGNOSIS — N390 Urinary tract infection, site not specified: Secondary | ICD-10-CM | POA: Diagnosis not present

## 2019-07-26 DIAGNOSIS — Z7401 Bed confinement status: Secondary | ICD-10-CM | POA: Diagnosis not present

## 2019-07-26 DIAGNOSIS — Z96642 Presence of left artificial hip joint: Secondary | ICD-10-CM | POA: Diagnosis not present

## 2019-07-26 DIAGNOSIS — Z466 Encounter for fitting and adjustment of urinary device: Secondary | ICD-10-CM | POA: Diagnosis not present

## 2019-07-26 LAB — CBC
HCT: 31 % — ABNORMAL LOW (ref 39.0–52.0)
Hemoglobin: 10.8 g/dL — ABNORMAL LOW (ref 13.0–17.0)
MCH: 32.3 pg (ref 26.0–34.0)
MCHC: 34.8 g/dL (ref 30.0–36.0)
MCV: 92.8 fL (ref 80.0–100.0)
Platelets: 230 10*3/uL (ref 150–400)
RBC: 3.34 MIL/uL — ABNORMAL LOW (ref 4.22–5.81)
RDW: 13.4 % (ref 11.5–15.5)
WBC: 13.6 10*3/uL — ABNORMAL HIGH (ref 4.0–10.5)
nRBC: 0 % (ref 0.0–0.2)

## 2019-07-26 LAB — BASIC METABOLIC PANEL
Anion gap: 9 (ref 5–15)
BUN: 30 mg/dL — ABNORMAL HIGH (ref 8–23)
CO2: 22 mmol/L (ref 22–32)
Calcium: 8.8 mg/dL — ABNORMAL LOW (ref 8.9–10.3)
Chloride: 107 mmol/L (ref 98–111)
Creatinine, Ser: 0.84 mg/dL (ref 0.61–1.24)
GFR calc Af Amer: >60 mL/min (ref >60)
GFR calc non Af Amer: >60 mL/min (ref >60)
Glucose, Bld: 102 mg/dL — ABNORMAL HIGH (ref 70–99)
Potassium: 4.3 mmol/L (ref 3.5–5.1)
Sodium: 138 mmol/L (ref 135–145)

## 2019-07-26 MED ORDER — CHLORHEXIDINE GLUCONATE CLOTH 2 % EX PADS
6.0000 | MEDICATED_PAD | Freq: Every day | CUTANEOUS | Status: DC
  Administered 2019-07-26 – 2019-07-29 (×4): 6 via TOPICAL

## 2019-07-26 MED ORDER — ACETAMINOPHEN 650 MG RE SUPP: 650.0000 mg | Freq: Four times a day (QID) | RECTAL | Status: DC | PRN

## 2019-07-26 MED ORDER — SIMVASTATIN 20 MG PO TABS
20.0000 mg | ORAL_TABLET | Freq: Every day | ORAL | Status: DC
  Administered 2019-07-26 – 2019-07-29 (×4): 20 mg via ORAL
  Filled 2019-07-26 (×5): qty 1

## 2019-07-26 MED ORDER — ASPIRIN EC 81 MG PO TBEC
81.0000 mg | DELAYED_RELEASE_TABLET | Freq: Every day | ORAL | Status: DC
  Administered 2019-07-27 – 2019-07-30 (×4): 81 mg via ORAL
  Filled 2019-07-26 (×4): qty 1

## 2019-07-26 MED ORDER — ONDANSETRON HCL 4 MG PO TABS: 4.0000 mg | ORAL_TABLET | Freq: Four times a day (QID) | ORAL | Status: DC | PRN

## 2019-07-26 MED ORDER — TAMSULOSIN HCL 0.4 MG PO CAPS
0.4000 mg | ORAL_CAPSULE | Freq: Every day | ORAL | Status: DC
  Administered 2019-07-27 – 2019-07-30 (×4): 0.4 mg via ORAL
  Filled 2019-07-26 (×4): qty 1

## 2019-07-26 MED ORDER — ONDANSETRON HCL 4 MG/2ML IJ SOLN: 4.0000 mg | Freq: Four times a day (QID) | INTRAMUSCULAR | Status: DC | PRN

## 2019-07-26 MED ORDER — SODIUM CHLORIDE 0.9 % IV SOLN
1.0000 g | INTRAVENOUS | Status: DC
  Administered 2019-07-27: 1 g via INTRAVENOUS
  Filled 2019-07-26: qty 10
  Filled 2019-07-26: qty 1

## 2019-07-26 MED ORDER — SENNOSIDES-DOCUSATE SODIUM 8.6-50 MG PO TABS: 1.0000 | ORAL_TABLET | Freq: Every evening | ORAL | Status: DC | PRN

## 2019-07-26 MED ORDER — FINASTERIDE 5 MG PO TABS
5.0000 mg | ORAL_TABLET | Freq: Every day | ORAL | Status: DC
  Administered 2019-07-27 – 2019-07-30 (×4): 5 mg via ORAL
  Filled 2019-07-26 (×5): qty 1

## 2019-07-26 MED ORDER — TORSEMIDE 10 MG PO TABS
10.0000 mg | ORAL_TABLET | Freq: Every day | ORAL | Status: DC
  Administered 2019-07-27 – 2019-07-30 (×4): 10 mg via ORAL
  Filled 2019-07-26 (×5): qty 1

## 2019-07-26 MED ORDER — ENOXAPARIN SODIUM 40 MG/0.4ML ~~LOC~~ SOLN
40.0000 mg | SUBCUTANEOUS | Status: DC
  Administered 2019-07-26 – 2019-07-30 (×5): 40 mg via SUBCUTANEOUS
  Filled 2019-07-26 (×5): qty 0.4

## 2019-07-26 MED ORDER — ACETAMINOPHEN 325 MG PO TABS: 650.0000 mg | ORAL_TABLET | Freq: Four times a day (QID) | ORAL | Status: DC | PRN

## 2019-07-26 NOTE — ED Notes (Signed)
Patient's daughter at bedside. Patient remains calm and cooperative. NAD.

## 2019-07-26 NOTE — ED Notes (Signed)
Pt alert to voice. Pt AxO to person only, could state first name but not B-day. Pt prefers top be covered w/ blankets. Allowed this RN to uncover to talk w/ him.

## 2019-07-26 NOTE — Progress Notes (Signed)
Patient seen and examined at the bedside.  Vital signs are stable.  He is confused and unable to provide any history.  He has dementia at baseline.  Continue current treatment

## 2019-07-26 NOTE — TOC Progression Note (Addendum)
Transition of Care [facility]) - Progression Note    Patient Details  Name: Victor Jones MRN: [id number] Date of Birth: 02/03/1929  [facility]) CM/SW Contact  [place] Cellar, RN Phone Number: 07/26/2019, 10:53 AM  Clinical Narrative:    Discussed case with medical director from [facility], Dr. Jacob Moores, regarding potential for patient to be custodial and not meet criteria for skilled however seeking LTC placement. [facility], MD is following closely and will discuss as discharge plan becomes more clear. Daughter is aware of concerns and discussing with family. Daughter states she [facility] with [facility] and [facility] are not able to assist in placement as patient is not disabled through the [place] system. Placement will be reviewed through [facility] plan.    Expected Discharge Plan: Skilled Nursing Facility Barriers to Discharge: Continued Medical Work up  Expected Discharge Plan and Services Expected Discharge Plan: Skilled Nursing Facility   Discharge Planning Services: CM Consult   Living arrangements for the past 2 months: Apartment                                       Social Determinants of Health (SDOH) Interventions    Readmission Risk Interventions No flowsheet data found.

## 2019-07-26 NOTE — ED Notes (Signed)
Pt cleaned and new chux and linens applied

## 2019-07-26 NOTE — TOC Initial Note (Signed)
Transition of Care [facility]) - Initial/Assessment Note    Patient Details  Name: Victor Jones MRN: [id number] Date of Birth: [birth year]  Transition of Care [facility]) CM/SW Contact:    [place] Cellar, RN Phone Number: 07/26/2019, 9:54 AM  Clinical Narrative:                 Spoke with daughter, Vikki Ports, states patients spouse, caregiver, is currently hospitalized and will not be able to assist with care at discharge. Family is seeking LTC placement. Medicaid application has been completed and is pending. Patient has combined income with wife of $2000/ monthly with $1000 required for rent. Family is unable to contribute financially to placement. Patient has walker at baseline and is alert and oriented with times of confusion prior to the last few UTI episodes in which daughter states confusion has been increasing. Daughter is requesting [employer] or placement in [place] if possible. Discussed with daughter possibility of patient not meeting criteria for skilled nursing and possibility of denial from [facility] Advantage. Daughter does not know what family will do if that happens but states she will talk it over with her sister today.   Expected Discharge Plan: Skilled Nursing Facility Barriers to Discharge: Continued Medical Work up   Patient Goals and CMS Choice Patient states their goals for this hospitalization and ongoing recovery are:: Seeking SNF placement      Expected Discharge Plan and Services Expected Discharge Plan: Skilled Nursing Facility   Discharge Planning Services: CM Consult   Living arrangements for the past 2 months: Apartment                                      Prior Living Arrangements/Services Living arrangements for the past 2 months: Apartment Lives with:: Spouse Patient language and need for interpreter reviewed:: Yes Do you feel safe going back to the place where you live?: No   wife no longer able to care for patient due to medical concerns  Need  for Family Participation in Patient Care: Yes (Comment) Care giver support system in place?: Yes (comment) Current home services: DME(walker) Criminal Activity/Legal Involvement Pertinent to Current Situation/Hospitalization: No - Comment as needed  Activities of Daily Living      Permission Sought/Granted                  Emotional Assessment Appearance:: Appears stated age Attitude/Demeanor/Rapport: Engaged Affect (typically observed): Accepting Orientation: : Oriented to Self, Oriented to  Time Alcohol / Substance Use: Never Used Psych Involvement: No (comment)  Admission diagnosis:  UTI (urinary tract infection) [N39.0] Patient Active Problem List   Diagnosis Date Noted  . Urinary tract infection 07/07/2019  . Acute urinary retention 07/07/2019  . Fall at home, initial encounter 07/07/2019  . Acute metabolic encephalopathy 07/07/2019  . Dementia with behavioral disturbance (HCC) 07/07/2019  . CAD (coronary artery disease) 07/07/2019  . AKI (acute kidney injury) (HCC) 07/07/2019  . UTI (urinary tract infection) 07/07/2019   PCP:  Marina Goodell, MD Pharmacy:   [facility] [address], [place] - [address] [place] [address] Phone: [phone] Fax: [phone]     Social Determinants of Health (SDOH) Interventions    Readmission Risk Interventions No flowsheet data found.

## 2019-07-26 NOTE — H&P (Signed)
History and Physical    Elvert Cumpton QQP:[id number] DOB: [birth year] DOA: 07/25/2019  PCP: Marina Goodell, MD   Patient coming from: home I have personally briefly reviewed patient's old medical records in [facility]  Chief Complaint: unable to urinate, confusion  HPI: Victor Jones is a 86 y.o. male with medical history significant for dementia, CAD with stent angioplasty followed by Dr. Lady Gary, hospitalized on 07/06/2019 with UTI and acute urinary retention presenting with acute metabolic encephalopathy, discharged with Foley catheter on 07/10/19, removed earlier today 07/25/18 in the outpatient setting by urology who was brought back to the emergency room due to inability to urinate after several hours.  Patient is unable to contribute to the history due to his dementia but there have been no reports of fever, abdominal pain, nausea or vomiting.  ED Course: On arrival in the emergency room, vitals were within normal limits.  WBC 10,800.  Urinalysis with large leukocyte negative nitrite consistent with UTI.  A Foley catheter was placed.  Hospitalist consulted for admission due to family concerns of inability to care for self.  Review of Systems: Unable to obtain given patient's dementia.  Oriented to self only  Past Medical History:  Diagnosis Date  . Dementia (HCC)     History reviewed. No pertinent surgical history.   reports that he has never smoked. He has never used smokeless tobacco. He reports previous alcohol use. No history on file for drug.  No Known Allergies  No family history on file.   Prior to Admission medications   Medication Sig Start Date End Date Taking? Authorizing Provider  aspirin EC 81 MG tablet Take 81 mg by mouth daily.    [provider]  docusate (COLACE) 60 MG/15ML syrup Take 15 mLs (60 mg total) by mouth daily. 07/19/19   Joni Reining, PA-C  finasteride (PROSCAR) 5 MG tablet Take 1 tablet (5 mg total) by mouth daily. 07/10/19 07/09/20   Pieter Partridge, MD  furosemide (LASIX) 20 MG tablet Take 20 mg by mouth daily. 05/28/19   [provider]  Multiple Vitamin (MULTI-VITAMIN) tablet Take by mouth.    [provider]  simvastatin (ZOCOR) 20 MG tablet Take 20 mg by mouth at bedtime. 04/30/19   [provider]  tamsulosin (FLOMAX) 0.4 MG CAPS capsule Take 1 capsule (0.4 mg total) by mouth daily. 07/11/19   Pieter Partridge, MD  torsemide (DEMADEX) 10 MG tablet Take 10 mg by mouth daily. 06/05/19   [provider]  traMADol Janean Sark) 50 MG tablet  05/28/19   [provider]    Physical Exam: Vitals:   07/25/19 2200 07/25/19 2230 07/25/19 2300 07/25/19 2330  BP: 115/72 127/74 129/69 119/69  Pulse:    72  Temp:      TempSrc:      SpO2:    100%  Weight:      Height:         Vitals:   07/25/19 2200 07/25/19 2230 07/25/19 2300 07/25/19 2330  BP: 115/72 127/74 129/69 119/69  Pulse:    72  Temp:      TempSrc:      SpO2:    100%  Weight:      Height:        Constitutional: Alert and awake, oriented x1, not in any acute distress. Eyes: PERLA, EOMI, irises appear normal, anicteric sclera,  ENMT: external ears and nose appear normal, normal hearing  Lips appears normal, oropharynx mucosa, tongue, posterior pharynx appear normal  Neck: neck appears normal, no masses, normal ROM, no thyromegaly, no JVD  CVS: S1-S2 clear, no murmur rubs or gallops,  , no carotid bruits, pedal pulses palpable, No LE edema Respiratory:  clear to auscultation bilaterally, no wheezing, rales or rhonchi. Respiratory effort normal. No accessory muscle use.  Abdomen: soft nontender, nondistended, normal bowel sounds, no hepatosplenomegaly, no hernias Musculoskeletal: : no cyanosis, clubbing , no contractures or atrophy Neuro: Cranial nerves II-XII intact, sensation, reflexes normal, strength Psych:stable mood and affect,  Skin: no rashes or lesions or ulcers, no induration or  nodules   Labs on Admission: I have personally reviewed following labs and imaging studies  CBC: Recent Labs  Lab 07/25/19 2027  WBC 16.8*  NEUTROABS 10.2*  HGB 11.7*  HCT 35.2*  MCV 95.9  PLT 248   Basic Metabolic Panel: Recent Labs  Lab 07/25/19 2027  NA 134*  K 4.8  CL 103  CO2 23  GLUCOSE 118*  BUN 30*  CREATININE 0.88  CALCIUM 9.1  MG 2.0   GFR: Estimated Creatinine Clearance: 61.6 mL/min (by C-G formula based on SCr of 0.88 mg/dL). Liver Function Tests: No results for input(s): AST, ALT, ALKPHOS, BILITOT, PROT, ALBUMIN in the last 168 hours. No results for input(s): LIPASE, AMYLASE in the last 168 hours. No results for input(s): AMMONIA in the last 168 hours. Coagulation Profile: No results for input(s): INR, PROTIME in the last 168 hours. Cardiac Enzymes: No results for input(s): CKTOTAL, CKMB, CKMBINDEX, TROPONINI in the last 168 hours. BNP (last 3 results) No results for input(s): PROBNP in the last 8760 hours. HbA1C: No results for input(s): HGBA1C in the last 72 hours. CBG: No results for input(s): GLUCAP in the last 168 hours. Lipid Profile: No results for input(s): CHOL, HDL, LDLCALC, TRIG, CHOLHDL, LDLDIRECT in the last 72 hours. Thyroid Function Tests: No results for input(s): TSH, T4TOTAL, FREET4, T3FREE, THYROIDAB in the last 72 hours. Anemia Panel: No results for input(s): VITAMINB12, FOLATE, FERRITIN, TIBC, IRON, RETICCTPCT in the last 72 hours. Urine analysis:    Component Value Date/Time   COLORURINE AMBER (A) 07/25/2019 2027   APPEARANCEUR HAZY (A) 07/25/2019 2027   LABSPEC 1.020 07/25/2019 2027   PHURINE 6.0 07/25/2019 2027   GLUCOSEU NEGATIVE 07/25/2019 2027   HGBUR NEGATIVE 07/25/2019 2027   BILIRUBINUR NEGATIVE 07/25/2019 2027   KETONESUR NEGATIVE 07/25/2019 2027   PROTEINUR 100 (A) 07/25/2019 2027   NITRITE NEGATIVE 07/25/2019 2027   LEUKOCYTESUR LARGE (A) 07/25/2019 2027    Radiological Exams on Admission: DG Chest 1  View  Result Date: 07/25/2019 CLINICAL DATA:  Fall EXAM: CHEST  1 VIEW COMPARISON:  07/06/2019 FINDINGS: No focal opacity or pleural effusion. Stable cardiomediastinal silhouette. No pneumothorax. Possible chronic right second rib deformity. IMPRESSION: No active disease. Electronically Signed   By: Jasmine Pang M.D.   On: 07/25/2019 20:50   CT Head Wo Contrast  Result Date: 07/25/2019 CLINICAL DATA:  Head trauma with headache EXAM: CT HEAD WITHOUT CONTRAST TECHNIQUE: Contiguous axial images were obtained from the base of the skull through the vertex without intravenous contrast. COMPARISON:  CT 07/06/2019 FINDINGS: Brain: Limited by motion degradation. No acute territorial infarction, hemorrhage or intracranial mass. Moderate severe atrophy. Moderate hypodensity in the white matter consistent with chronic small vessel ischemic change. Stable ventricle size. Vascular: No hyperdense vessels.  Carotid vascular calcification Skull: Normal. Negative for fracture or focal lesion. Sinuses/Orbits: No acute finding. Other: None IMPRESSION:  1. No CT evidence for acute intracranial abnormality allowing for motion degradation 2. Atrophy and chronic small vessel ischemic change of the white matter Electronically Signed   By: Donavan Foil M.D.   On: 07/25/2019 20:41   CT Cervical Spine Wo Contrast  Result Date: 07/25/2019 CLINICAL DATA:  Dementia, unable to urinate EXAM: CT CERVICAL SPINE WITHOUT CONTRAST TECHNIQUE: Multidetector CT imaging of the cervical spine was performed without intravenous contrast. Multiplanar CT image reconstructions were also generated. COMPARISON:  CT 07/06/2019 FINDINGS: Alignment: No subluxation. Trace anterolisthesis C4 on C5 without change. Facet alignment is maintained. Skull base and vertebrae: No acute fracture. No primary bone lesion or focal pathologic process. Soft tissues and spinal canal: No prevertebral fluid or swelling. No visible canal hematoma. Disc levels: Diffuse  degenerative changes throughout the cervical spine, moderate severe at C5-C6, C6-C7 and C7-T1. Facet degenerative changes at multiple levels with foraminal stenosis at multiple levels. Upper chest: Apical pleural and parenchymal scarring with calcification Other: None IMPRESSION: Degenerative changes.  No acute osseous abnormality. Electronically Signed   By: Donavan Foil M.D.   On: 07/25/2019 20:46   DG Pelvis Portable  Result Date: 07/25/2019 CLINICAL DATA:  Failure to thrive, fall EXAM: PORTABLE PELVIS 1-2 VIEWS COMPARISON:  07/06/2019 FINDINGS: SI joints are non widened. Pubic symphysis and rami appear intact. Left hip prosthesis without dislocation. Lucency about the acetabular component. No acute fracture. Limited evaluation of the right femoral neck secondary to obscuration by the trochanter. Advanced arthritis of the right hip with effaced joint space, sclerosis and subchondral cyst. IMPRESSION: 1. Left hip replacement without fracture or malalignment 2. Advanced arthritis right hip. Limited evaluation of the right hip for acute osseous trauma secondary to positioning Electronically Signed   By: Donavan Foil M.D.   On: 07/25/2019 20:49    EKG: Independently reviewed.   Assessment/Plan    Urinary tract infection -IV Rocephin -Follow cultures    Acute urinary retention   Indwelling Foley catheter -Patient failed voiding trial after having indwelling Foley for 2 weeks -Continue Foley -Consider urology evaluation -Continue Flomax    Dementia with behavioral disturbance ([facility]) -Increase nursing care -As needed low-dose Haldol -Inability to care for patient at home    CAD (coronary artery disease) -Appears stable -Continue aspirin, simvastatin.  Not currently on beta-blocker     DVT prophylaxis: Lovenox  Code Status: full code  Family Communication:  none  Disposition Plan: Patient may need skilled facility Consults called: none  Status:inp    Athena Masse MD [facility]     07/26/2019, 12:08 AM

## 2019-07-26 NOTE — ED Notes (Signed)
Report given to inpatient nurse, transportation requested.

## 2019-07-26 NOTE — ED Notes (Addendum)
Pt's hands are covered in stool. Blankets changed, pt's hands cleaned and pt assisted with lunch tray.

## 2019-07-27 DIAGNOSIS — N3090 Cystitis, unspecified without hematuria: Principal | ICD-10-CM

## 2019-07-27 LAB — BASIC METABOLIC PANEL
Anion gap: 7 (ref 5–15)
BUN: 28 mg/dL — ABNORMAL HIGH (ref 8–23)
CO2: 25 mmol/L (ref 22–32)
Calcium: 8.9 mg/dL (ref 8.9–10.3)
Chloride: 107 mmol/L (ref 98–111)
Creatinine, Ser: 0.65 mg/dL (ref 0.61–1.24)
GFR calc Af Amer: >60 mL/min (ref >60)
GFR calc non Af Amer: >60 mL/min (ref >60)
Glucose, Bld: 93 mg/dL (ref 70–99)
Potassium: 3.8 mmol/L (ref 3.5–5.1)
Sodium: 139 mmol/L (ref 135–145)

## 2019-07-27 LAB — CBC WITH DIFFERENTIAL/PLATELET
Abs Immature Granulocytes: 0.13 10*3/uL — ABNORMAL HIGH (ref 0.00–0.07)
Basophils Absolute: 0.1 10*3/uL (ref 0.0–0.1)
Basophils Relative: 1 %
Eosinophils Absolute: 0.2 10*3/uL (ref 0.0–0.5)
Eosinophils Relative: 2 %
HCT: 33.7 % — ABNORMAL LOW (ref 39.0–52.0)
Hemoglobin: 11.3 g/dL — ABNORMAL LOW (ref 13.0–17.0)
Immature Granulocytes: 1 %
Lymphocytes Relative: 24 %
Lymphs Abs: 2.3 10*3/uL (ref 0.7–4.0)
MCH: 31.7 pg (ref 26.0–34.0)
MCHC: 33.5 g/dL (ref 30.0–36.0)
MCV: 94.7 fL (ref 80.0–100.0)
Monocytes Absolute: 0.9 10*3/uL (ref 0.1–1.0)
Monocytes Relative: 10 %
Neutro Abs: 6 10*3/uL (ref 1.7–7.7)
Neutrophils Relative %: 62 %
Platelets: 247 10*3/uL (ref 150–400)
RBC: 3.56 MIL/uL — ABNORMAL LOW (ref 4.22–5.81)
RDW: 13.2 % (ref 11.5–15.5)
WBC: 9.6 10*3/uL (ref 4.0–10.5)
nRBC: 0 % (ref 0.0–0.2)

## 2019-07-27 MED ORDER — SODIUM CHLORIDE 0.9 % IV SOLN
1.0000 g | Freq: Four times a day (QID) | INTRAVENOUS | Status: DC
  Administered 2019-07-27 – 2019-07-29 (×8): 1 g via INTRAVENOUS
  Filled 2019-07-27 (×3): qty 1000
  Filled 2019-07-27: qty 1
  Filled 2019-07-27 (×6): qty 1000

## 2019-07-27 MED ORDER — SODIUM CHLORIDE 0.9 % IV SOLN
INTRAVENOUS | Status: DC | PRN
  Administered 2019-07-27: 250 mL via INTRAVENOUS

## 2019-07-27 NOTE — Progress Notes (Signed)
Progress Note    [name]  QIH:[id number] DOB: 1928/12/25  DOA: 07/25/2019 PCP: Marina Goodell, MD      Brief Narrative:    Medical records reviewed and are as summarized below:  Victor Jones is an 86 y.o. male Victor Jones is a 86 y.o. male with medical history significant for dementia, CAD with stent angioplasty followed by Dr. Lady Gary, hospitalized on 07/06/2019 with UTI and acute urinary retention presenting with acute metabolic encephalopathy, discharged with Foley catheter on 07/10/19, removed earlier today 07/25/18 in the outpatient setting by urology who was brought back to the emergency room due to inability to urinate after several hours.   She was found to have acute UTI.      Assessment/Plan:   Active Problems:   Urinary tract infection   Acute urinary retention   Dementia with behavioral disturbance (HCC)   CAD (coronary artery disease)   UTI (urinary tract infection)    Urinary tract infection -Urine culture showing Enterococcus faecalis and gram-negative rods blood culture has been reincubated.  Continue IV Rocephin for now.    Acute urinary retention   Indwelling Foley catheter -Patient failed voiding trial after having indwelling Foley for 2 weeks -Continue Foley -Continue Flomax and finasteride -Outpatient follow-up with urologist         Dementia with behavioral disturbance (HCC) -Continue supportive care.  PT and OT evaluation  Daughter wants him to go to SNF because patient's wife can no longer take care of him at home.  Apparently he has been combative at times and it has been difficult even to feed him at home.     CAD (coronary artery disease) -Appears stable -Continue aspirin, simvastatin.  Not currently on beta-blocker       Body mass index is 22.42 kg/m.   Family Communication/Anticipated D/C date and plan/Code Status   DVT prophylaxis: Lovenox Code Status: Full code Family Communication: Plan discussed with his  daughter, Vikki Ports, over the phone Disposition Plan: Patient is from home.  Daughter wants him to go to SNF because patient's wife can no longer take care of him at home.        Subjective:   No complaints but he's unable   Objective:    Vitals:   07/26/19 1845 07/27/19 0428 07/27/19 1144 07/27/19 1301  BP: 130/73 127/81 130/87 120/64  Pulse: 79 79 75 65  Resp: 16 16  16   Temp: 98.4 F (36.9 C) (!) 97.5 F (36.4 C) 97.6 F (36.4 C) 97.6 F (36.4 C)  TempSrc: Oral Oral Oral Oral  SpO2: 100% 99% 100% 100%  Weight:      Height:        Intake/Output Summary (Last 24 hours) at 07/27/2019 1514 Last data filed at 07/27/2019 1300 Gross per 24 hour  Intake --  Output 1075 ml  Net -1075 ml   Filed Weights   07/25/19 1919  Weight: 75 kg    Exam:  GEN: NAD SKIN: No rash EYES: EOMI ENT: MMM CV: RRR PULM: CTA B ABD: soft, ND, NT, +BS CNS: AAO x person and place, non focal EXT: No edema or tenderness GU: Foley catheter draining amber urine   Data Reviewed:   I have personally reviewed following labs and imaging studies:  Labs: Labs show the following:   Basic Metabolic Panel: Recent Labs  Lab 07/25/19 2027 07/25/19 2027 07/26/19 0452 07/27/19 0559  NA 134*  --  138 139  K 4.8   < > 4.3  3.8  CL 103  --  107 107  CO2 23  --  22 25  GLUCOSE 118*  --  102* 93  BUN 30*  --  30* 28*  CREATININE 0.88  --  0.84 0.65  CALCIUM 9.1  --  8.8* 8.9  MG 2.0  --   --   --    < > = values in this interval not displayed.   GFR Estimated Creatinine Clearance: 67.7 mL/min (by C-G formula based on SCr of 0.65 mg/dL). Liver Function Tests: No results for input(s): AST, ALT, ALKPHOS, BILITOT, PROT, ALBUMIN in the last 168 hours. No results for input(s): LIPASE, AMYLASE in the last 168 hours. No results for input(s): AMMONIA in the last 168 hours. Coagulation profile No results for input(s): INR, PROTIME in the last 168 hours.  CBC: Recent Labs  Lab 07/25/19 2027  07/26/19 0452 07/27/19 0559  WBC 16.8* 13.6* 9.6  NEUTROABS 10.2*  --  6.0  HGB 11.7* 10.8* 11.3*  HCT 35.2* 31.0* 33.7*  MCV 95.9 92.8 94.7  PLT 248 230 247   Cardiac Enzymes: No results for input(s): CKTOTAL, CKMB, CKMBINDEX, TROPONINI in the last 168 hours. BNP (last 3 results) No results for input(s): PROBNP in the last 8760 hours. CBG: No results for input(s): GLUCAP in the last 168 hours. D-Dimer: No results for input(s): DDIMER in the last 72 hours. Hgb A1c: No results for input(s): HGBA1C in the last 72 hours. Lipid Profile: No results for input(s): CHOL, HDL, LDLCALC, TRIG, CHOLHDL, LDLDIRECT in the last 72 hours. Thyroid function studies: No results for input(s): TSH, T4TOTAL, T3FREE, THYROIDAB in the last 72 hours.  Invalid input(s): FREET3 Anemia work up: No results for input(s): VITAMINB12, FOLATE, FERRITIN, TIBC, IRON, RETICCTPCT in the last 72 hours. Sepsis Labs: Recent Labs  Lab 07/25/19 2027 07/26/19 0452 07/27/19 0559  WBC 16.8* 13.6* 9.6    Microbiology Recent Results (from the past 240 hour(s))  Respiratory Panel by RT PCR (Flu A&B, Covid) - Nasopharyngeal Swab     Status: None   Collection Time: 07/25/19  8:27 PM   Specimen: Nasopharyngeal Swab  Result Value Ref Range Status   SARS Coronavirus 2 by RT PCR NEGATIVE NEGATIVE Final    Comment: (NOTE) SARS-CoV-2 target nucleic acids are NOT DETECTED. The SARS-CoV-2 RNA is generally detectable in upper respiratoy specimens during the acute phase of infection. The lowest concentration of SARS-CoV-2 viral copies this assay can detect is 131 copies/mL. A negative result does not preclude SARS-Cov-2 infection and should not be used as the sole basis for treatment or other patient management decisions. A negative result may occur with  improper specimen collection/handling, submission of specimen other than nasopharyngeal swab, presence of viral mutation(s) within the areas targeted by this assay, and  inadequate number of viral copies (<131 copies/mL). A negative result must be combined with clinical observations, patient history, and epidemiological information. The expected result is Negative. Fact Sheet for Patients:  https://www.moore.com/ Fact Sheet for Healthcare Providers:  https://www.young.biz/ This test is not yet ap proved or cleared by the [place] FDA and  has been authorized for detection and/or diagnosis of SARS-CoV-2 by FDA under an Emergency Use Authorization (EUA). This EUA will remain  in effect (meaning this test can be used) for the duration of the COVID-19 declaration under Section 564(b)(1) of the Act, 21 U.S.C. section 360bbb-3(b)(1), unless the authorization is terminated or revoked sooner.    Influenza A by PCR NEGATIVE NEGATIVE Final  Influenza B by PCR NEGATIVE NEGATIVE Final    Comment: (NOTE) The Xpert Xpress SARS-CoV-2/FLU/RSV assay is intended as an aid in  the diagnosis of influenza from Nasopharyngeal swab specimens and  should not be used as a sole basis for treatment. Nasal washings and  aspirates are unacceptable for Xpert Xpress SARS-CoV-2/FLU/RSV  testing. Fact Sheet for Patients: PinkCheek.be Fact Sheet for Healthcare Providers: GravelBags.it This test is not yet approved or cleared by the [place] FDA and  has been authorized for detection and/or diagnosis of SARS-CoV-2 by  FDA under an Emergency Use Authorization (EUA). This EUA will remain  in effect (meaning this test can be used) for the duration of the  Covid-19 declaration under Section 564(b)(1) of the Act, 21  U.S.C. section 360bbb-3(b)(1), unless the authorization is  terminated or revoked. Performed at [facility], [address] [place], [place] [address]   Urine Culture     Status: Abnormal (Preliminary result)   Collection Time: 07/25/19  8:27 PM    Specimen: Urine, Random  Result Value Ref Range Status   Specimen Description URINE, RANDOM  Final   Special Requests   Final    NONE Performed at [facility], [address], [place], [place] [address]    Culture (A)  Final    >=100,000 COLONIES/mL GRAM NEGATIVE RODS >=100,000 COLONIES/mL ENTEROCOCCUS FAECALIS CULTURE REINCUBATED FOR BETTER GROWTH    Report Status PENDING  Incomplete   Organism ID, Bacteria ENTEROCOCCUS FAECALIS (A)  Final      Susceptibility   Enterococcus faecalis - MIC*    AMPICILLIN <=2 SENSITIVE Sensitive     NITROFURANTOIN <=16 SENSITIVE Sensitive     VANCOMYCIN 1 SENSITIVE Sensitive     * >=100,000 COLONIES/mL ENTEROCOCCUS FAECALIS    Procedures and diagnostic studies:  DG Chest 1 View  Result Date: 07/25/2019 CLINICAL DATA:  Fall EXAM: CHEST  1 VIEW COMPARISON:  07/06/2019 FINDINGS: No focal opacity or pleural effusion. Stable cardiomediastinal silhouette. No pneumothorax. Possible chronic right second rib deformity. IMPRESSION: No active disease. Electronically Signed   By: Donavan Foil M.D.   On: 07/25/2019 20:50   CT Head Wo Contrast  Result Date: 07/25/2019 CLINICAL DATA:  Head trauma with headache EXAM: CT HEAD WITHOUT CONTRAST TECHNIQUE: Contiguous axial images were obtained from the base of the skull through the vertex without intravenous contrast. COMPARISON:  CT 07/06/2019 FINDINGS: Brain: Limited by motion degradation. No acute territorial infarction, hemorrhage or intracranial mass. Moderate severe atrophy. Moderate hypodensity in the white matter consistent with chronic small vessel ischemic change. Stable ventricle size. Vascular: No hyperdense vessels.  Carotid vascular calcification Skull: Normal. Negative for fracture or focal lesion. Sinuses/Orbits: No acute finding. Other: None IMPRESSION: 1. No CT evidence for acute intracranial abnormality allowing for motion degradation 2. Atrophy and chronic small vessel ischemic change of the  white matter Electronically Signed   By: Donavan Foil M.D.   On: 07/25/2019 20:41   CT Cervical Spine Wo Contrast  Result Date: 07/25/2019 CLINICAL DATA:  Dementia, unable to urinate EXAM: CT CERVICAL SPINE WITHOUT CONTRAST TECHNIQUE: Multidetector CT imaging of the cervical spine was performed without intravenous contrast. Multiplanar CT image reconstructions were also generated. COMPARISON:  CT 07/06/2019 FINDINGS: Alignment: No subluxation. Trace anterolisthesis C4 on C5 without change. Facet alignment is maintained. Skull base and vertebrae: No acute fracture. No primary bone lesion or focal pathologic process. Soft tissues and spinal canal: No prevertebral fluid or swelling. No visible canal hematoma. Disc levels: Diffuse  degenerative changes throughout the cervical spine, moderate severe at C5-C6, C6-C7 and C7-T1. Facet degenerative changes at multiple levels with foraminal stenosis at multiple levels. Upper chest: Apical pleural and parenchymal scarring with calcification Other: None IMPRESSION: Degenerative changes.  No acute osseous abnormality. Electronically Signed   By: Jasmine Pang M.D.   On: 07/25/2019 20:46   DG Pelvis Portable  Result Date: 07/25/2019 CLINICAL DATA:  Failure to thrive, fall EXAM: PORTABLE PELVIS 1-2 VIEWS COMPARISON:  07/06/2019 FINDINGS: SI joints are non widened. Pubic symphysis and rami appear intact. Left hip prosthesis without dislocation. Lucency about the acetabular component. No acute fracture. Limited evaluation of the right femoral neck secondary to obscuration by the trochanter. Advanced arthritis of the right hip with effaced joint space, sclerosis and subchondral cyst. IMPRESSION: 1. Left hip replacement without fracture or malalignment 2. Advanced arthritis right hip. Limited evaluation of the right hip for acute osseous trauma secondary to positioning Electronically Signed   By: Jasmine Pang M.D.   On: 07/25/2019 20:49    Medications:   . aspirin EC  81  mg Oral Daily  . Chlorhexidine Gluconate Cloth  6 each Topical Daily  . enoxaparin (LOVENOX) injection  40 mg Subcutaneous Q24H  . finasteride  5 mg Oral Daily  . simvastatin  20 mg Oral QHS  . tamsulosin  0.4 mg Oral Daily  . torsemide  10 mg Oral Daily   Continuous Infusions: . sodium chloride 250 mL (07/27/19 0443)  . cefTRIAXone (ROCEPHIN)  IV 1 g (07/27/19 0526)     LOS: 1 day   Kymberlyn Eckford  [facility]     07/27/2019, 3:14 PM

## 2019-07-27 NOTE — Plan of Care (Signed)
Continuing with plan of care. 

## 2019-07-28 DIAGNOSIS — F0391 Unspecified dementia with behavioral disturbance: Secondary | ICD-10-CM

## 2019-07-28 LAB — URINE CULTURE: Culture: >=100000 — AB

## 2019-07-28 MED ORDER — ADULT MULTIVITAMIN W/MINERALS CH
1.0000 | ORAL_TABLET | Freq: Every day | ORAL | Status: DC
  Administered 2019-07-28 – 2019-07-30 (×3): 1 via ORAL
  Filled 2019-07-28 (×3): qty 1

## 2019-07-28 MED ORDER — LEVOFLOXACIN 500 MG PO TABS
500.0000 mg | ORAL_TABLET | Freq: Every day | ORAL | Status: DC
  Administered 2019-07-28 – 2019-07-30 (×3): 500 mg via ORAL
  Filled 2019-07-28 (×3): qty 1

## 2019-07-28 MED ORDER — ENSURE ENLIVE PO LIQD
237.0000 mL | Freq: Two times a day (BID) | ORAL | Status: DC
  Administered 2019-07-28 – 2019-07-30 (×4): 237 mL via ORAL

## 2019-07-28 NOTE — Evaluation (Signed)
Occupational Therapy Evaluation Patient Details Name: Victor Jones MRN: [id number] DOB: [birth year] Today's Date: 07/28/2019    History of Present Illness Per MD note:Victor Jones is an 87 y.o. male Victor Jones is a 86 y.o. male with medical history significant for dementia, CAD with stent angioplasty followed by Dr. Ubaldo Glassing, hospitalized on 07/06/2019 with UTI and acute urinary retention presenting with acute metabolic encephalopathy, discharged with Foley catheter on 07/10/19, removed earlier today 07/25/18 in the outpatient setting by urology who was brought back to the emergency room due to inability to urinate after several hours, Patient has UTI.   Clinical Impression   Victor Jones was seen for OT evaluation this date. Prior to hospital admission, pt required supervision for all ADLs. Pt lives with wife and per chart review has hx of falls. Pt presents to acute OT demonstrating impaired ADL performance, functional cognition, and functional mobility 2/2 functional strength/endurance deficits, poor command following, and decreased insight into deficits. Pt currently requires SUPERVISION for bed level ADLs (no physical assistance required for self-feeding/grooming tasks but pt demonstrated limited initiation of ADLs, requiring MAX multimodal cues. Once engaged pt sequences appropriately with ADL tasks but requires VCs for thoroughness/completion). Anticipate physical assistance required for seated ADLs and LB access 2/2 pt's decreased safety awareness and limited activity tolerance. Pt would benefit from skilled OT to address noted impairments and functional limitations (see below for any additional details) in order to maximize safety and independence while minimizing falls risk and caregiver burden. Upon hospital discharge, recommend STR to maximize pt safety and return to PLOF.     Follow Up Recommendations  SNF    Equipment Recommendations       Recommendations for Other Services        Precautions / Restrictions Precautions Precautions: Fall Restrictions Weight Bearing Restrictions: No      Mobility Bed Mobility Overal bed mobility: Needs Assistance Bed Mobility: Rolling Rolling: Mod assist         General bed mobility comments: Unable to follow commands but MOD A to reposition torso and BLE for optimal position prior to self-feeding  Transfers                 General transfer comment: Unable to follow commands, pt returned to blankets after eating and OT could not engage further for OOB assessment    Balance                                           ADL either performed or assessed with clinical judgement   ADL Overall ADL's : Needs assistance/impaired                                       General ADL Comments: SUP self-feeding reclined in bed (including opening all lids/straws and cutting meat with knife/fork)- B mitts removed and pt makes no attempt to pull at IV. Does not follow verbal commands but responds well to pbject prompts. SETUP wash face reclined in bed. Pt pulls blankets up to neck and says "alright," pt does not follow prompts to engage in LBD this session. Anticipate increased assistance for LB access seated EOB 2/2 decreased safety awareness      Vision         Perception     Praxis  Pertinent Vitals/Pain Pain Assessment: Faces Faces Pain Scale: No hurt     Hand Dominance Left   Extremity/Trunk Assessment Upper Extremity Assessment Upper Extremity Assessment: Overall WFL for tasks assessed   Lower Extremity Assessment Lower Extremity Assessment: Generalized weakness       Communication Communication Communication: No difficulties(Pt states "yes" "okay" to all comments/questions)   Cognition Arousal/Alertness: Awake/alert Behavior During Therapy: WFL for tasks assessed/performed Overall Cognitive Status: History of cognitive impairments - at baseline                                  General Comments: States "yes" "ok" "no" and "alright" to statements and questions from OT. Very pleasant. Pt is not oriented to place, time, or situation stating "yes" to all orientation questions.    General Comments       Exercises Other Exercises Other Exercises: Self-feeding, repositioning for meal and comfort at end of session, face washing, bed mobility   Shoulder Instructions      Home Living Family/patient expects to be discharged to:: Skilled nursing facility Living Arrangements: Spouse/significant other Available Help at Discharge: Family;Available 24 hours/day;Personal care attendant;Available PRN/intermittently(Per OT eval 07/08/19 PCA 2hrs/day M-F.) Type of Home: Apartment Home Access: Level entry     Home Layout: One level     Bathroom Shower/Tub: Chief Strategy Officer: Standard     Home Equipment: Environmental consultant - 2 wheels;Walker - 4 wheels;Cane - single point;Shower seat;Grab bars - toilet;Grab bars - tub/shower   Additional Comments: Pt unable and caregiver unavailable to give uypdated home setup. Per OT eval 07/08/19: Sp[facility] reports that [facility] tub bench as       Prior Functioning/Environment Level of Independence: Needs assistance        Comments: Unable to obtain from pt. Per 07/08/19 OT eval: pt required  supervision for all ADLs and requiring increasing assistance        OT Problem List: Decreased strength;Decreased activity tolerance;Decreased cognition;Decreased safety awareness;Decreased knowledge of use of DME or AE      OT Treatment/Interventions: Self-care/ADL training;Therapeutic exercise;DME and/or AE instruction;Therapeutic activities;Patient/family education;Balance training    OT Goals(Current goals can be found in the care plan section) Acute Rehab OT Goals Patient Stated Goal: no goals stated OT Goal Formulation: Patient unable to participate in goal setting Time For Goal Achievement:  08/11/19 Potential to Achieve Goals: Good ADL Goals Pt Will Perform Lower Body Dressing: with min assist;sit to/from stand Pt Will Transfer to Toilet: stand pivot transfer;bedside commode;with min guard assist Pt Will Perform Toileting - Clothing Manipulation and hygiene: with min assist;sit to/from stand;with caregiver independent in assisting(c LRAD PRN)  OT Frequency: Min 2X/week   Barriers to D/C: Decreased caregiver support          Co-evaluation              AM-PAC OT "6 Clicks" Daily Activity     Outcome Measure Help from another person eating meals?: A Little Help from another person taking care of personal grooming?: A Little Help from another person toileting, which includes using toliet, bedpan, or urinal?: A Little Help from another person bathing (including washing, rinsing, drying)?: A Lot Help from another person to put on and taking off regular upper body clothing?: A Little Help from another person to put on and taking off regular lower body clothing?: A Little 6 Click Score: 17   End of Session  Activity Tolerance: Other (comment);Patient tolerated treatment well(Pt limited by poor command following) Patient left: in bed;with call bell/phone within reach;with restraints reapplied(B mitts applied)  OT Visit Diagnosis: Unsteadiness on feet (R26.81);Muscle weakness (generalized) (M62.81);History of falling (Z91.81)                Time: 4034-7425 OT Time Calculation (min): 26 min Charges:  OT General Charges $OT Visit: 1 Visit OT Evaluation $OT Eval Low Complexity: 1 Low OT Treatments $Self Care/Home Management : 8-22 mins  Kathie Dike, M.S. OTR/L  07/28/19, 2:32 PM

## 2019-07-28 NOTE — Progress Notes (Signed)
Physical Therapy Evaluation Patient Details Name: Victor Jones MRN: [id number] DOB: [birth year] Today's Date: 07/28/2019   History of Present Illness  Per MD note:Victor Jones is an 86 y.o. male Victor Jones is a 86 y.o. male with medical history significant for dementia, CAD with stent angioplasty followed by Dr. Lady Gary, hospitalized on 07/06/2019 with UTI and acute urinary retention presenting with acute metabolic encephalopathy, discharged with Foley catheter on 07/10/19, removed earlier today 07/25/18 in the outpatient setting by urology who was brought back to the emergency room due to inability to urinate after several hours, Patient has UTI.  Clinical Impression  Patient is alert and answers yes and no to questions.He is unable to give any PLOF.  He performs supine to sit bed mobility with mod assist. He has fair sitting balance with posterior lean. He transfers with HHA x 1 and is able to stand for several minutes with fair static standing balance needing HHA x 1 . He is unsteady and unable to follow commands for ambulation. He has 3/5 strength B hips and knees and reports no pain.He will benefit from skilled PT to improve mobility and strength.      Follow Up Recommendations SNF    Equipment Recommendations  Rolling walker with 5" wheels    Recommendations for Other Services       Precautions / Restrictions Restrictions Weight Bearing Restrictions: No      Mobility  Bed Mobility Overal bed mobility: Needs Assistance Bed Mobility: Supine to Sit;Sit to Supine     Supine to sit: Mod assist Sit to supine: Mod assist   General bed mobility comments: unable to follow commands  Transfers Overall transfer level: Needs assistance Equipment used: 1 person hand held assist Transfers: Sit to/from Stand Sit to Stand: Mod assist         General transfer comment: HHA x 1 for sit to stand  Ambulation/Gait                Stairs            Wheelchair Mobility     Modified Rankin (Stroke Patients Only)       Balance Overall balance assessment: Needs assistance Sitting-balance support: Bilateral upper extremity supported;Feet supported Sitting balance-Leahy Scale: Fair   Postural control: Posterior lean Standing balance support: Bilateral upper extremity supported Standing balance-Leahy Scale: Fair                               Pertinent Vitals/Pain Pain Assessment: No/denies pain    Home Living Family/patient expects to be discharged to:: Skilled nursing facility Living Arrangements: (unknown)                    Prior Function Level of Independence: (unknown)               Hand Dominance        Extremity/Trunk Assessment   Upper Extremity Assessment Upper Extremity Assessment: (mittens placed, uses UE during assessement)    Lower Extremity Assessment Lower Extremity Assessment: Generalized weakness;RLE deficits/detail;LLE deficits/detail RLE: (3/5 hip flex and knee ext) LLE: (3/6 hip flex and knee ext)       Communication   Communication: No difficulties(Pateint answers yes and no, unable to determine accuracy )  Cognition Arousal/Alertness: Awake/alert Behavior During Therapy: WFL for tasks assessed/performed Overall Cognitive Status: History of cognitive impairments - at baseline  General Comments      Exercises     Assessment/Plan    PT Assessment Patient needs continued PT services  PT Problem List Decreased strength;Decreased activity tolerance;Decreased balance;Decreased mobility;Decreased cognition;Decreased knowledge of precautions       PT Treatment Interventions Gait training;Functional mobility training;Therapeutic activities;Therapeutic exercise;Balance training    PT Goals (Current goals can be found in the Care Plan section)  Acute Rehab PT Goals Patient Stated Goal: no goals stated PT Goal Formulation: Patient  unable to participate in goal setting Time For Goal Achievement: 08/11/19 Potential to Achieve Goals: Fair    Frequency Min 2X/week   Barriers to discharge        Co-evaluation               AM-PAC PT "6 Clicks" Mobility  Outcome Measure Help needed turning from your back to your side while in a flat bed without using bedrails?: A Lot Help needed moving from lying on your back to sitting on the side of a flat bed without using bedrails?: A Lot Help needed moving to and from a bed to a chair (including a wheelchair)?: A Lot Help needed standing up from a chair using your arms (e.g., wheelchair or bedside chair)?: A Lot Help needed to walk in hospital room?: Total Help needed climbing 3-5 steps with a railing? : Total 6 Click Score: 10    End of Session Equipment Utilized During Treatment: Gait belt Activity Tolerance: Patient tolerated treatment well;Patient limited by lethargy Patient left: in bed;with bed alarm set Nurse Communication: Mobility status PT Visit Diagnosis: Unsteadiness on feet (R26.81);Muscle weakness (generalized) (M62.81);Difficulty in walking, not elsewhere classified (R26.2)    Time: 1324-4010 PT Time Calculation (min) (ACUTE ONLY): 20 min   Charges:   PT Evaluation $PT Eval Low Complexity: 1 Low PT Treatments $Therapeutic Activity: 8-22 mins          Alanson Puls, PT DPT 07/28/2019, 9:38 AM

## 2019-07-28 NOTE — Plan of Care (Signed)
Continuing with plan of care. 

## 2019-07-28 NOTE — Progress Notes (Signed)
Initial Nutrition Assessment   **RD working remotely**  DOCUMENTATION CODES:   Not applicable  INTERVENTION:  Ensure Enlive po BID, each supplement provides 350 kcal and 20 grams of protein  MVI daily   NUTRITION DIAGNOSIS:   Inadequate oral intake related to lethargy/confusion as evidenced by meal completion < 50%.    GOAL:   Patient will meet greater than or equal to 90% of their needs    MONITOR:   PO intake, Supplement acceptance, Weight trends, Labs  REASON FOR ASSESSMENT:   Malnutrition Screening Tool    ASSESSMENT:   Pt with a PMH significant for dementia, CAD with stent angioplasty, and recent hospitalization on 2/27 with UTI and acute urinary retention with acute metabolic encephalopathy. Pt was d/c with foley catheter on 07/10/19 which was removed on 07/25/19 in outpatient setting. Pt returned to the ED on 07/25/19 due to inability to urinate after several hours and was admitted with UTI  RD unable to reach pt via phone.   No PO intake documented. RN reports pt consuming 25-50% of meals.    Labs and medications reviewed.  UOP: x24 hours I/O: -1,687.48ml since admit  NUTRITION - FOCUSED PHYSICAL EXAM:  RD unable to perform at this time.   Diet Order:   Diet Order            Diet Heart Room service appropriate? Yes; Fluid consistency: Thin  Diet effective now              EDUCATION NEEDS:   No education needs have been identified at this time  Skin:  Skin Assessment: Reviewed RN Assessment  Last BM:  3/20  Height:   Ht Readings from Last 1 Encounters:  07/25/19 6' (1.829 m)    Weight:   Wt Readings from Last 1 Encounters:  07/25/19 75 kg    BMI:  Body mass index is 22.42 kg/m.  Estimated Nutritional Needs:   Kcal:  1850-2050  Protein:  90-105 grams  Fluid:  >1.85 L/d   Eugene Gavia, MS, RD, LDN RD pager number and weekend/on-call pager number located in Amion.

## 2019-07-28 NOTE — Progress Notes (Addendum)
Progress Note    Victor Jones  FGH:[id number] DOB: 1928-10-09  DOA: 07/25/2019 PCP: Marina Goodell, MD      Brief Narrative:    Medical records reviewed and are as summarized below:  Victor Jones is an 86 y.o. male Victor Jones is a 86 y.o. male with medical history significant for dementia, CAD with stent angioplasty followed by Dr. Lady Gary, hospitalized on 07/06/2019 with UTI and acute urinary retention presenting with acute metabolic encephalopathy, discharged with Foley catheter on 07/10/19, removed earlier today 07/25/18 in the outpatient setting by urology who was brought back to the emergency room due to inability to urinate after several hours.   She was found to have acute UTI.      Assessment/Plan:   Active Problems:   Urinary tract infection   Acute urinary retention   Dementia with behavioral disturbance (HCC)   CAD (coronary artery disease)   UTI (urinary tract infection)   Cystitis    Urinary tract infection -Urine culture showing Enterococcus faecalis and pseudomonas.  Start Levaquin for Pseudomonas and change IV ampicillin to amoxicillin for Enterococcus faecalis.  No growth on blood cultures thus far.    Acute urinary retention   Indwelling Foley catheter -Patient failed voiding trial after having indwelling Foley for 2 weeks -Continue Foley -Continue Flomax and finasteride -Outpatient follow-up with urologist         Dementia with behavioral disturbance (HCC) -Continue supportive care.  PT and OT evaluation  Daughter wants him to go to SNF because patient's wife can no longer take care of him at home.  Apparently he has been combative at times and it has been difficult even to feed him at home.     CAD (coronary artery disease) -Appears stable -Continue aspirin, simvastatin.  Not currently on beta-blocker   Debility PT recommends SNF.  Follow-up with social worker to assist with disposition    Body mass index is 22.42 kg/m.   Family  Communication/Anticipated D/C date and plan/Code Status   DVT prophylaxis: Lovenox Code Status: Full code Family Communication: None Disposition Plan: Patient is from home.  Daughter wants him to go to SNF because patient's wife can no longer take care of him at home.        Subjective:   No complaints.  He said he feels okay.  No pain or shortness of breath.    Objective:    Vitals:   07/27/19 1301 07/27/19 1915 07/28/19 0455 07/28/19 1136  BP: 120/64 115/63 131/74 130/69  Pulse: 65 78 73 68  Resp: 16 16 20 15   Temp: 97.6 F (36.4 C) 98 F (36.7 C) 97.7 F (36.5 C) 97.6 F (36.4 C)  TempSrc: Oral Axillary Axillary Oral  SpO2: 100% 100% 100% 100%  Weight:      Height:        Intake/Output Summary (Last 24 hours) at 07/28/2019 1154 Last data filed at 07/28/2019 0516 Gross per 24 hour  Intake 212.3 ml  Output 875 ml  Net -662.7 ml   Filed Weights   07/25/19 1919  Weight: 75 kg    Exam:  GEN: No acute distress SKIN: No rash EYES: EOMI ENT: MMM CV: RRR PULM: No wheezing or rales heard ABD: soft, ND, NT, +BS CNS: AAO x person and place, non focal EXT: No edema or tenderness GU: Foley catheter draining amber urine   Data Reviewed:   I have personally reviewed following labs and imaging studies:  Labs: Labs show the following:  Basic Metabolic Panel: Recent Labs  Lab 07/25/19 2027 07/25/19 2027 07/26/19 0452 07/27/19 0559  NA 134*  --  138 139  K 4.8   < > 4.3 3.8  CL 103  --  107 107  CO2 23  --  22 25  GLUCOSE 118*  --  102* 93  BUN 30*  --  30* 28*  CREATININE 0.88  --  0.84 0.65  CALCIUM 9.1  --  8.8* 8.9  MG 2.0  --   --   --    < > = values in this interval not displayed.   GFR Estimated Creatinine Clearance: 67.7 mL/min (by C-G formula based on SCr of 0.65 mg/dL). Liver Function Tests: No results for input(s): AST, ALT, ALKPHOS, BILITOT, PROT, ALBUMIN in the last 168 hours. No results for input(s): LIPASE, AMYLASE in the last 168  hours. No results for input(s): AMMONIA in the last 168 hours. Coagulation profile No results for input(s): INR, PROTIME in the last 168 hours.  CBC: Recent Labs  Lab 07/25/19 2027 07/26/19 0452 07/27/19 0559  WBC 16.8* 13.6* 9.6  NEUTROABS 10.2*  --  6.0  HGB 11.7* 10.8* 11.3*  HCT 35.2* 31.0* 33.7*  MCV 95.9 92.8 94.7  PLT 248 230 247   Cardiac Enzymes: No results for input(s): CKTOTAL, CKMB, CKMBINDEX, TROPONINI in the last 168 hours. BNP (last 3 results) No results for input(s): PROBNP in the last 8760 hours. CBG: No results for input(s): GLUCAP in the last 168 hours. D-Dimer: No results for input(s): DDIMER in the last 72 hours. Hgb A1c: No results for input(s): HGBA1C in the last 72 hours. Lipid Profile: No results for input(s): CHOL, HDL, LDLCALC, TRIG, CHOLHDL, LDLDIRECT in the last 72 hours. Thyroid function studies: No results for input(s): TSH, T4TOTAL, T3FREE, THYROIDAB in the last 72 hours.  Invalid input(s): FREET3 Anemia work up: No results for input(s): VITAMINB12, FOLATE, FERRITIN, TIBC, IRON, RETICCTPCT in the last 72 hours. Sepsis Labs: Recent Labs  Lab 07/25/19 2027 07/26/19 0452 07/27/19 0559  WBC 16.8* 13.6* 9.6    Microbiology Recent Results (from the past 240 hour(s))  Respiratory Panel by RT PCR (Flu A&B, Covid) - Nasopharyngeal Swab     Status: None   Collection Time: 07/25/19  8:27 PM   Specimen: Nasopharyngeal Swab  Result Value Ref Range Status   SARS Coronavirus 2 by RT PCR NEGATIVE NEGATIVE Final    Comment: (NOTE) SARS-CoV-2 target nucleic acids are NOT DETECTED. The SARS-CoV-2 RNA is generally detectable in upper respiratoy specimens during the acute phase of infection. The lowest concentration of SARS-CoV-2 viral copies this assay can detect is 131 copies/mL. A negative result does not preclude SARS-Cov-2 infection and should not be used as the sole basis for treatment or other patient management decisions. A negative result  may occur with  improper specimen collection/handling, submission of specimen other than nasopharyngeal swab, presence of viral mutation(s) within the areas targeted by this assay, and inadequate number of viral copies (<131 copies/mL). A negative result must be combined with clinical observations, patient history, and epidemiological information. The expected result is Negative. Fact Sheet for Patients:  PinkCheek.be Fact Sheet for Healthcare Providers:  GravelBags.it This test is not yet ap proved or cleared by the [place] FDA and  has been authorized for detection and/or diagnosis of SARS-CoV-2 by FDA under an Emergency Use Authorization (EUA). This EUA will remain  in effect (meaning this test can be used) for the duration of the COVID-19  declaration under Section 564(b)(1) of the Act, 21 U.S.C. section 360bbb-3(b)(1), unless the authorization is terminated or revoked sooner.    Influenza A by PCR NEGATIVE NEGATIVE Final   Influenza B by PCR NEGATIVE NEGATIVE Final    Comment: (NOTE) The Xpert Xpress SARS-CoV-2/FLU/RSV assay is intended as an aid in  the diagnosis of influenza from Nasopharyngeal swab specimens and  should not be used as a sole basis for treatment. Nasal washings and  aspirates are unacceptable for Xpert Xpress SARS-CoV-2/FLU/RSV  testing. Fact Sheet for Patients: https://www.moore.com/ Fact Sheet for Healthcare Providers: https://www.young.biz/ This test is not yet approved or cleared by the [place] FDA and  has been authorized for detection and/or diagnosis of SARS-CoV-2 by  FDA under an Emergency Use Authorization (EUA). This EUA will remain  in effect (meaning this test can be used) for the duration of the  Covid-19 declaration under Section 564(b)(1) of the Act, 21  U.S.C. section 360bbb-3(b)(1), unless the authorization is  terminated or  revoked. Performed at [facility], [address] [place], [place] [address]   Urine Culture     Status: Abnormal   Collection Time: 07/25/19  8:27 PM   Specimen: Urine, Random  Result Value Ref Range Status   Specimen Description URINE, RANDOM  Final   Special Requests   Final    NONE Performed at [facility], [address] [place], [place] [address]    Culture (A)  Final    >=100,000 COLONIES/mL PSEUDOMONAS AERUGINOSA >=100,000 COLONIES/mL ENTEROCOCCUS FAECALIS    Report Status 07/28/2019 FINAL  Final   Organism ID, Bacteria ENTEROCOCCUS FAECALIS (A)  Final   Organism ID, Bacteria PSEUDOMONAS AERUGINOSA (A)  Final      Susceptibility   Enterococcus faecalis - MIC*    AMPICILLIN <=2 SENSITIVE Sensitive     NITROFURANTOIN <=16 SENSITIVE Sensitive     VANCOMYCIN 1 SENSITIVE Sensitive     * >=100,000 COLONIES/mL ENTEROCOCCUS FAECALIS   Pseudomonas aeruginosa - MIC*    CEFTAZIDIME 2 SENSITIVE Sensitive     CIPROFLOXACIN <=0.25 SENSITIVE Sensitive     GENTAMICIN <=1 SENSITIVE Sensitive     IMIPENEM 2 SENSITIVE Sensitive     PIP/TAZO 8 SENSITIVE Sensitive     CEFEPIME 2 SENSITIVE Sensitive     * >=100,000 COLONIES/mL PSEUDOMONAS AERUGINOSA    Procedures and diagnostic studies:  No results found.  Medications:   . aspirin EC  81 mg Oral Daily  . Chlorhexidine Gluconate Cloth  6 each Topical Daily  . enoxaparin (LOVENOX) injection  40 mg Subcutaneous Q24H  . finasteride  5 mg Oral Daily  . simvastatin  20 mg Oral QHS  . tamsulosin  0.4 mg Oral Daily  . torsemide  10 mg Oral Daily   Continuous Infusions: . sodium chloride Stopped (07/27/19 0652)  . ampicillin (OMNIPEN) IV 1 g (07/28/19 0517)     LOS: 2 days   Lebaron Bautch  [facility]     07/28/2019, 11:54 AM

## 2019-07-29 MED ORDER — AMOXICILLIN 500 MG PO CAPS
500.0000 mg | ORAL_CAPSULE | Freq: Three times a day (TID) | ORAL | Status: DC
  Administered 2019-07-29 – 2019-07-30 (×2): 500 mg via ORAL
  Filled 2019-07-29 (×5): qty 1

## 2019-07-29 NOTE — NC FL2 (Signed)
[place] MEDICAID FL2 LEVEL OF CARE SCREENING TOOL     IDENTIFICATION  Patient Name: Victor Jones Birthdate: [birth year] Sex: male Admission Date (Current Location): 07/25/2019  [facility] and [place] Number:  Chiropodist and Address:  [facility], [address], [place], [place] [address]      Provider Number: [id number]  Attending Physician Name and Address:  Lurene Shadow, MD  Relative Name and Phone Number:       Current Level of Care: Hospital Recommended Level of Care: Skilled Nursing Facility Prior Approval Number:   [id number]  Date Approved/Denied:   PASRR Number:   [id number]  Discharge Plan: SNF    Current Diagnoses: Patient Active Problem List   Diagnosis Date Noted  . Cystitis   . Urinary tract infection 07/07/2019  . Acute urinary retention 07/07/2019  . Fall at home, initial encounter 07/07/2019  . Acute metabolic encephalopathy 07/07/2019  . Dementia with behavioral disturbance (HCC) 07/07/2019  . CAD (coronary artery disease) 07/07/2019  . AKI (acute kidney injury) (HCC) 07/07/2019  . UTI (urinary tract infection) 07/07/2019    Orientation RESPIRATION BLADDER Height & Weight     Self  Normal Continent, Indwelling catheter Weight: 165 lb 5.5 oz (75 kg) Height:  6' (182.9 cm)  BEHAVIORAL SYMPTOMS/MOOD NEUROLOGICAL BOWEL NUTRITION STATUS  (None) (Dementia) Incontinent Diet(Heart healthy)  AMBULATORY STATUS COMMUNICATION OF NEEDS Skin   Extensive Assist Verbally(Rambled speech per RN assessment.) Skin abrasions, Bruising, Other (Comment)(Skin tear.)                       Personal Care Assistance Level of Assistance  Bathing, Feeding, Dressing Bathing Assistance: Limited assistance Feeding assistance: Limited assistance Dressing Assistance: Limited assistance     Functional Limitations Info  Sight, Hearing, Speech Sight Info: Adequate Hearing Info: Adequate Speech Info: Impaired(Rambled speech)     SPECIAL CARE FACTORS FREQUENCY  PT (By licensed PT), OT (By licensed OT)     PT Frequency: 5 x week OT Frequency: 5 x week            Contractures Contractures Info: Not present    Additional Factors Info  Code Status, Allergies Code Status Info: Full code Allergies Info: NKDA           Current Medications (07/29/2019):  This is the current hospital active medication list Current Facility-Administered Medications  Medication Dose Route Frequency Provider Last Rate Last Admin  . 0.9 %  sodium chloride infusion   Intravenous PRN Lurene Shadow, MD   Stopped at 07/27/19 [phone]  . acetaminophen (TYLENOL) tablet 650 mg  650 mg Oral Q6H PRN Andris Baumann, MD       Or  . acetaminophen (TYLENOL) suppository 650 mg  650 mg Rectal Q6H PRN Andris Baumann, MD      . ampicillin (OMNIPEN) 1 g in sodium chloride 0.9 % 100 mL IVPB  1 g Intravenous Q6H Lurene Shadow, MD 300 mL/hr at 07/29/19 0553 1 g at 07/29/19 0553  . aspirin EC tablet 81 mg  81 mg Oral Daily Lurene Shadow, MD   81 mg at 07/29/19 1021  . Chlorhexidine Gluconate Cloth 2 % PADS 6 each  6 each Topical Daily Lurene Shadow, MD   6 each at 07/28/19 1351  . enoxaparin (LOVENOX) injection 40 mg  40 mg Subcutaneous Q24H Lindajo Royal V, MD   40 mg at 07/29/19 1021  . feeding supplement (ENSURE ENLIVE) (ENSURE ENLIVE) liquid 237 mL  237 mL Oral BID BM Jennye Boroughs, MD   237 mL at 07/29/19 1021  . finasteride (PROSCAR) tablet 5 mg  5 mg Oral Daily Jennye Boroughs, MD   5 mg at 07/29/19 1021  . levofloxacin (LEVAQUIN) tablet 500 mg  500 mg Oral Daily Jennye Boroughs, MD   500 mg at 07/29/19 1022  . multivitamin with minerals tablet 1 tablet  1 tablet Oral Daily Jennye Boroughs, MD   1 tablet at 07/29/19 1021  . ondansetron (ZOFRAN) tablet 4 mg  4 mg Oral Q6H PRN Athena Masse, MD       Or  . ondansetron [facility] injection 4 mg  4 mg Intravenous Q6H PRN Athena Masse, MD      . senna-docusate (Senokot-S) tablet 1 tablet  1  tablet Oral QHS PRN Athena Masse, MD      . simvastatin (ZOCOR) tablet 20 mg  20 mg Oral QHS Jennye Boroughs, MD   20 mg at 07/28/19 2242  . tamsulosin (FLOMAX) capsule 0.4 mg  0.4 mg Oral Daily Jennye Boroughs, MD   0.4 mg at 07/29/19 1021  . torsemide (DEMADEX) tablet 10 mg  10 mg Oral Daily Jennye Boroughs, MD   10 mg at 07/29/19 1022     Discharge Medications: Please see discharge summary for a list of discharge medications.  Relevant Imaging Results:  Relevant Lab Results:   Additional Information SS#: [id number]  Candie Chroman, LCSW

## 2019-07-29 NOTE — Progress Notes (Signed)
Progress Note    Victor Jones  JIR:[id number] DOB: 24-Jun-1928  DOA: 07/25/2019 PCP: Sofie Hartigan, MD      Brief Narrative:    Medical records reviewed and are as summarized below:  Victor Jones is an 86 y.o. male Victor Jones is a 86 y.o. male with medical history significant for dementia, CAD with stent angioplasty followed by Dr. Ubaldo Glassing, hospitalized on 07/06/2019 with UTI and acute urinary retention presenting with acute metabolic encephalopathy, discharged with Foley catheter on 07/10/19, removed earlier today 07/25/18 in the outpatient setting by urology who was brought back to the emergency room due to inability to urinate after several hours.   She was found to have acute UTI.      Assessment/Plan:   Active Problems:   Urinary tract infection   Acute urinary retention   Dementia with behavioral disturbance (HCC)   CAD (coronary artery disease)   UTI (urinary tract infection)   Cystitis    Urinary tract infection -Urine culture showed Enterococcus faecalis and pseudomonas.  Continue Levaquin for Pseudomonas and change IV ampicillin to amoxicillin for Enterococcus faecalis.    Acute urinary retention   Indwelling Foley catheter -Patient failed voiding trial after having indwelling Foley for 2 weeks -Continue Foley -Continue Flomax and finasteride -Outpatient follow-up with urologist         Dementia with behavioral disturbance ([place]) -Continue supportive care.  PT and OT evaluation  Daughter wants him to go to SNF because patient's wife can no longer take care of him at home.  Apparently he has been combative at times and it has been difficult even to feed him at home.     CAD (coronary artery disease) -Appears stable -Continue aspirin, simvastatin.  Not currently on beta-blocker   Debility PT recommends SNF.  Follow-up with social worker to assist with disposition    Body mass index is 22.42 kg/m.   Family Communication/Anticipated D/C date and  plan/Code Status   DVT prophylaxis: Lovenox Code Status: Full code Family Communication: None Disposition Plan: Patient is from home.  Daughter wants him to go to SNF because patient's wife can no longer take care of him at home.  Patient is medically stable for discharge.  Awaiting placement to SNF.      Subjective:   He has no complaints.  No pain or shortness of breath.  Objective:    Vitals:   07/28/19 2206 07/29/19 0544 07/29/19 1333 07/29/19 1347  BP: 130/71 135/74 (!) 82/59 109/68  Pulse: 74 67 76 72  Resp: 16 16 18    Temp: 97.9 F (36.6 C) (!) 96.9 F (36.1 C) 98 F (36.7 C)   TempSrc: Oral Axillary Oral   SpO2: 100% 100% (!) 85% 100%  Weight:      Height:        Intake/Output Summary (Last 24 hours) at 07/29/2019 1456 Last data filed at 07/29/2019 1400 Gross per 24 hour  Intake 510.88 ml  Output 1475 ml  Net -964.12 ml   Filed Weights   07/25/19 1919  Weight: 75 kg    Exam:  GEN: No acute distress SKIN: No rash EYES: Anicteric ENT: MMM CV: RRR PULM: Clear to auscultation bilaterally ABD: soft, ND, NT, +BS CNS: AAO x person and place, non focal EXT: No edema or tenderness GU: Foley catheter draining amber urine   Data Reviewed:   I have personally reviewed following labs and imaging studies:  Labs: Labs show the following:   Basic Metabolic Panel:  Recent Labs  Lab 07/25/19 2027 07/25/19 2027 07/26/19 0452 07/27/19 0559  NA 134*  --  138 139  K 4.8   < > 4.3 3.8  CL 103  --  107 107  CO2 23  --  22 25  GLUCOSE 118*  --  102* 93  BUN 30*  --  30* 28*  CREATININE 0.88  --  0.84 0.65  CALCIUM 9.1  --  8.8* 8.9  MG 2.0  --   --   --    < > = values in this interval not displayed.   GFR Estimated Creatinine Clearance: 67.7 mL/min (by C-G formula based on SCr of 0.65 mg/dL). Liver Function Tests: No results for input(s): AST, ALT, ALKPHOS, BILITOT, PROT, ALBUMIN in the last 168 hours. No results for input(s): LIPASE, AMYLASE in  the last 168 hours. No results for input(s): AMMONIA in the last 168 hours. Coagulation profile No results for input(s): INR, PROTIME in the last 168 hours.  CBC: Recent Labs  Lab 07/25/19 2027 07/26/19 0452 07/27/19 0559  WBC 16.8* 13.6* 9.6  NEUTROABS 10.2*  --  6.0  HGB 11.7* 10.8* 11.3*  HCT 35.2* 31.0* 33.7*  MCV 95.9 92.8 94.7  PLT 248 230 247   Cardiac Enzymes: No results for input(s): CKTOTAL, CKMB, CKMBINDEX, TROPONINI in the last 168 hours. BNP (last 3 results) No results for input(s): PROBNP in the last 8760 hours. CBG: No results for input(s): GLUCAP in the last 168 hours. D-Dimer: No results for input(s): DDIMER in the last 72 hours. Hgb A1c: No results for input(s): HGBA1C in the last 72 hours. Lipid Profile: No results for input(s): CHOL, HDL, LDLCALC, TRIG, CHOLHDL, LDLDIRECT in the last 72 hours. Thyroid function studies: No results for input(s): TSH, T4TOTAL, T3FREE, THYROIDAB in the last 72 hours.  Invalid input(s): FREET3 Anemia work up: No results for input(s): VITAMINB12, FOLATE, FERRITIN, TIBC, IRON, RETICCTPCT in the last 72 hours. Sepsis Labs: Recent Labs  Lab 07/25/19 2027 07/26/19 0452 07/27/19 0559  WBC 16.8* 13.6* 9.6    Microbiology Recent Results (from the past 240 hour(s))  Respiratory Panel by RT PCR (Flu A&B, Covid) - Nasopharyngeal Swab     Status: None   Collection Time: 07/25/19  8:27 PM   Specimen: Nasopharyngeal Swab  Result Value Ref Range Status   SARS Coronavirus 2 by RT PCR NEGATIVE NEGATIVE Final    Comment: (NOTE) SARS-CoV-2 target nucleic acids are NOT DETECTED. The SARS-CoV-2 RNA is generally detectable in upper respiratoy specimens during the acute phase of infection. The lowest concentration of SARS-CoV-2 viral copies this assay can detect is 131 copies/mL. A negative result does not preclude SARS-Cov-2 infection and should not be used as the sole basis for treatment or other patient management decisions. A  negative result may occur with  improper specimen collection/handling, submission of specimen other than nasopharyngeal swab, presence of viral mutation(s) within the areas targeted by this assay, and inadequate number of viral copies (<131 copies/mL). A negative result must be combined with clinical observations, patient history, and epidemiological information. The expected result is Negative. Fact Sheet for Patients:  https://www.moore.com/ Fact Sheet for Healthcare Providers:  https://www.young.biz/ This test is not yet ap proved or cleared by the [place] FDA and  has been authorized for detection and/or diagnosis of SARS-CoV-2 by FDA under an Emergency Use Authorization (EUA). This EUA will remain  in effect (meaning this test can be used) for the duration of the COVID-19 declaration under Section  564(b)(1) of the Act, 21 U.S.C. section 360bbb-3(b)(1), unless the authorization is terminated or revoked sooner.    Influenza A by PCR NEGATIVE NEGATIVE Final   Influenza B by PCR NEGATIVE NEGATIVE Final    Comment: (NOTE) The Xpert Xpress SARS-CoV-2/FLU/RSV assay is intended as an aid in  the diagnosis of influenza from Nasopharyngeal swab specimens and  should not be used as a sole basis for treatment. Nasal washings and  aspirates are unacceptable for Xpert Xpress SARS-CoV-2/FLU/RSV  testing. Fact Sheet for Patients: https://www.moore.com/ Fact Sheet for Healthcare Providers: https://www.young.biz/ This test is not yet approved or cleared by the [place] FDA and  has been authorized for detection and/or diagnosis of SARS-CoV-2 by  FDA under an Emergency Use Authorization (EUA). This EUA will remain  in effect (meaning this test can be used) for the duration of the  Covid-19 declaration under Section 564(b)(1) of the Act, 21  U.S.C. section 360bbb-3(b)(1), unless the authorization is    terminated or revoked. Performed at [facility], [address] [place], [place] [address]   Urine Culture     Status: Abnormal   Collection Time: 07/25/19  8:27 PM   Specimen: Urine, Random  Result Value Ref Range Status   Specimen Description URINE, RANDOM  Final   Special Requests   Final    NONE Performed at [facility], [address] [place], [place] [address]    Culture (A)  Final    >=100,000 COLONIES/mL PSEUDOMONAS AERUGINOSA >=100,000 COLONIES/mL ENTEROCOCCUS FAECALIS    Report Status 07/28/2019 FINAL  Final   Organism ID, Bacteria ENTEROCOCCUS FAECALIS (A)  Final   Organism ID, Bacteria PSEUDOMONAS AERUGINOSA (A)  Final      Susceptibility   Enterococcus faecalis - MIC*    AMPICILLIN <=2 SENSITIVE Sensitive     NITROFURANTOIN <=16 SENSITIVE Sensitive     VANCOMYCIN 1 SENSITIVE Sensitive     * >=100,000 COLONIES/mL ENTEROCOCCUS FAECALIS   Pseudomonas aeruginosa - MIC*    CEFTAZIDIME 2 SENSITIVE Sensitive     CIPROFLOXACIN <=0.25 SENSITIVE Sensitive     GENTAMICIN <=1 SENSITIVE Sensitive     IMIPENEM 2 SENSITIVE Sensitive     PIP/TAZO 8 SENSITIVE Sensitive     CEFEPIME 2 SENSITIVE Sensitive     * >=100,000 COLONIES/mL PSEUDOMONAS AERUGINOSA    Procedures and diagnostic studies:  No results found.  Medications:   . amoxicillin  500 mg Oral Q8H  . aspirin EC  81 mg Oral Daily  . Chlorhexidine Gluconate Cloth  6 each Topical Daily  . enoxaparin (LOVENOX) injection  40 mg Subcutaneous Q24H  . feeding supplement (ENSURE ENLIVE)  237 mL Oral BID BM  . finasteride  5 mg Oral Daily  . levofloxacin  500 mg Oral Daily  . multivitamin with minerals  1 tablet Oral Daily  . simvastatin  20 mg Oral QHS  . tamsulosin  0.4 mg Oral Daily  . torsemide  10 mg Oral Daily   Continuous Infusions: . sodium chloride Stopped (07/27/19 0652)     LOS: 3 days   Shuree Brossart  [facility]     07/29/2019, 2:56 PM

## 2019-07-29 NOTE — TOC Progression Note (Addendum)
Transition of Care [facility]) - Progression Note    Patient Details  Name: Victor Jones MRN: [id number] Date of Birth: June 25, 1928  [facility] CM/SW Contact  Margarito Liner, LCSW Phone Number: 07/29/2019, 10:50 AM  Clinical Narrative: CSW called patient's wife. She confirmed they are looking into SNF placement for him and [facility] was first preference. Patient's wife recently got out of the hospital and is not feeling well so she asked CSW to call her daughter, Vikki Ports, to discuss. CSW spoke to Orick who also confirmed that [facility] is first preference. Patient's Medicaid application was submitted on Friday. Plan is for LTC at a SNF after rehab once Medicaid is approved. Notified Vikki Ports that [facility] requires $10-$20 copay per day for SNF placement for first 20 days then it is increased to $160 per day. Unsure if [facility] will pick up any copays after the initial 20 days. Daughter said they usually do not.    11:14 am: [employer] coordinator said she has already spoken to family and they will accept patient when he is ready for discharge. Will keep her updated on anticipated discharge date.  12:45 pm: Per MD, patient stable for discharge. Called to let daughter know. Patient has had both COVID vaccines so he will not need a new COVID test. [facility] voicemail at [facility]. Will start insurance authorization when they call back.  Expected Discharge Plan: Skilled Nursing Facility Barriers to Discharge: Continued Medical Work up  Expected Discharge Plan and Services Expected Discharge Plan: Skilled Nursing Facility   Discharge Planning Services: CM Consult   Living arrangements for the past 2 months: Apartment                                       Social Determinants of Health (SDOH) Interventions    Readmission Risk Interventions No flowsheet data found.

## 2019-07-29 NOTE — TOC Progression Note (Addendum)
Transition of Care [facility]) - Progression Note    Patient Details  Name: Victor Jones MRN: [id number] Date of Birth: May 02, 1929  Transition of Care [facility]) CM/SW Contact  Chapman Fitch, RN Phone Number: 07/29/2019, 1:08 PM  Clinical Narrative:     Received  Call from Crystal at [facility] advantage.  She is starting [place] for non emergent EMS transport, and SNF  1404 - Received insurance auth for Ambulance transport.  Rep states that [facility] is good for 3 months.  Auth number ([id number]  Expected Discharge Plan: Skilled Nursing Facility Barriers to Discharge: Continued Medical Work up  Expected Discharge Plan and Services Expected Discharge Plan: Skilled Nursing Facility   Discharge Planning Services: CM Consult   Living arrangements for the past 2 months: Apartment                                       Social Determinants of Health (SDOH) Interventions    Readmission Risk Interventions No flowsheet data found.

## 2019-07-30 ENCOUNTER — Ambulatory Visit: Payer: No Typology Code available for payment source | Admitting: Urology

## 2019-07-30 DIAGNOSIS — M2550 Pain in unspecified joint: Secondary | ICD-10-CM | POA: Diagnosis not present

## 2019-07-30 DIAGNOSIS — Z466 Encounter for fitting and adjustment of urinary device: Secondary | ICD-10-CM | POA: Diagnosis not present

## 2019-07-30 DIAGNOSIS — R5381 Other malaise: Secondary | ICD-10-CM | POA: Diagnosis not present

## 2019-07-30 DIAGNOSIS — Z96642 Presence of left artificial hip joint: Secondary | ICD-10-CM | POA: Diagnosis not present

## 2019-07-30 DIAGNOSIS — R338 Other retention of urine: Secondary | ICD-10-CM | POA: Diagnosis not present

## 2019-07-30 DIAGNOSIS — N390 Urinary tract infection, site not specified: Secondary | ICD-10-CM | POA: Diagnosis not present

## 2019-07-30 DIAGNOSIS — R339 Retention of urine, unspecified: Secondary | ICD-10-CM | POA: Diagnosis not present

## 2019-07-30 DIAGNOSIS — M1612 Unilateral primary osteoarthritis, left hip: Secondary | ICD-10-CM | POA: Diagnosis not present

## 2019-07-30 DIAGNOSIS — I2510 Atherosclerotic heart disease of native coronary artery without angina pectoris: Secondary | ICD-10-CM | POA: Diagnosis not present

## 2019-07-30 DIAGNOSIS — Z7401 Bed confinement status: Secondary | ICD-10-CM | POA: Diagnosis not present

## 2019-07-30 DIAGNOSIS — F0391 Unspecified dementia with behavioral disturbance: Secondary | ICD-10-CM | POA: Diagnosis not present

## 2019-07-30 DIAGNOSIS — N3090 Cystitis, unspecified without hematuria: Secondary | ICD-10-CM | POA: Diagnosis not present

## 2019-07-30 DIAGNOSIS — B965 Pseudomonas (aeruginosa) (mallei) (pseudomallei) as the cause of diseases classified elsewhere: Secondary | ICD-10-CM | POA: Diagnosis not present

## 2019-07-30 MED ORDER — LEVOFLOXACIN 500 MG PO TABS: 500.0000 mg | ORAL_TABLET | Freq: Every day | ORAL | 0 refills | Status: AC

## 2019-07-30 MED ORDER — AMOXICILLIN 500 MG PO CAPS: 500.0000 mg | ORAL_CAPSULE | Freq: Three times a day (TID) | ORAL | 0 refills | Status: AC

## 2019-07-30 NOTE — Discharge Summary (Addendum)
Physician Discharge Summary  Victor Jones EVO:[id number] DOB: February 28, 1929 DOA: 07/25/2019  PCP: Victor Goodell, MD  Admit date: 07/25/2019 Discharge date: 07/30/2019  Discharge disposition: Skilled nursing facility   Recommendations for Outpatient Follow-Up:   Follow-up with physician at the nursing home within 3 days of discharge Follow-up with urologist in 1 week for reevaluation of urinary retention with indwelling Foley catheter.   Discharge Diagnosis:   Active Problems:   Urinary tract infection   Acute urinary retention   Dementia with behavioral disturbance (HCC)   CAD (coronary artery disease)   UTI (urinary tract infection)   Cystitis    Discharge Condition: Stable.  Diet recommendation: Low-salt diet  Code status: Full code.    Hospital Course:   Mr. Victor Jones is an 86 y.o. male with medical history significant fordementia, CAD with stent angioplasty followed by Dr. Lady Jones, hospitalized on 07/06/2019 with UTI and acute urinary retention presenting with acute metabolic encephalopathy, discharged with Foley catheteron 07/10/19.  Foley catheter was removed on 3/18/20in the outpatient setting by urology.  However, patient was brought back to the emergency room due to inability to urinate after several hours.    He was admitted to the hospital for acute urinary retention and acute UTI.  He was treated with empiric IV antibiotics.  Urine culture revealed Pseudomonas aeruginosa and Enterococcus faecalis.  He will be discharged on oral Levaquin and amoxicillin.  He had leukocytosis on admission but this has resolved.  Patient will be discharged to the nursing home with Foley catheter.  He needs to follow-up with a urologist for evaluation of his urinary retention and need for chronic indwelling Foley catheter.  Discharge plan was discussed with the patient's daughter, Ms. Victor Jones, over the phone.    Discharge Exam:   Vitals:   07/30/19 0452 07/30/19 0543    BP: 103/68 103/75  Pulse: 88 90  Resp: 18 18  Temp: (!) 97.4 F (36.3 C) 97.9 F (36.6 C)  SpO2: 100% 99%   Vitals:   07/29/19 1347 07/29/19 2038 07/30/19 0452 07/30/19 0543  BP: 109/68 97/62 103/68 103/75  Pulse: 72 85 88 90  Resp:  18 18 18   Temp:  98 F (36.7 C) (!) 97.4 F (36.3 C) 97.9 F (36.6 C)  TempSrc:  Oral Oral Oral  SpO2: 100% 99% 100% 99%  Weight:      Height:         GEN: NAD SKIN: No rash EYES: EOMI ENT: MMM CV: RRR PULM: CTA B ABD: soft, ND, NT, +BS CNS: AAO x person and place, non focal EXT: No edema or tenderness GU: Foley catheter draining amber urine   The results of significant diagnostics from this hospitalization (including imaging, microbiology, ancillary and laboratory) are listed below for reference.     Procedures and Diagnostic Studies:   DG Chest 1 View  Result Date: 07/25/2019 CLINICAL DATA:  Fall EXAM: CHEST  1 VIEW COMPARISON:  07/06/2019 FINDINGS: No focal opacity or pleural effusion. Stable cardiomediastinal silhouette. No pneumothorax. Possible chronic right second rib deformity. IMPRESSION: No active disease. Electronically Signed   By: 07/08/2019 M.D.   On: 07/25/2019 20:50   CT Head Wo Contrast  Result Date: 07/25/2019 CLINICAL DATA:  Head trauma with headache EXAM: CT HEAD WITHOUT CONTRAST TECHNIQUE: Contiguous axial images were obtained from the base of the skull through the vertex without intravenous contrast. COMPARISON:  CT 07/06/2019 FINDINGS: Brain: Limited by motion degradation. No acute territorial infarction, hemorrhage or intracranial  mass. Moderate severe atrophy. Moderate hypodensity in the white matter consistent with chronic small vessel ischemic change. Stable ventricle size. Vascular: No hyperdense vessels.  Carotid vascular calcification Skull: Normal. Negative for fracture or focal lesion. Sinuses/Orbits: No acute finding. Other: None IMPRESSION: 1. No CT evidence for acute intracranial abnormality allowing  for motion degradation 2. Atrophy and chronic small vessel ischemic change of the white matter Electronically Signed   By: Victor Jones M.D.   On: 07/25/2019 20:41   CT Cervical Spine Wo Contrast  Result Date: 07/25/2019 CLINICAL DATA:  Dementia, unable to urinate EXAM: CT CERVICAL SPINE WITHOUT CONTRAST TECHNIQUE: Multidetector CT imaging of the cervical spine was performed without intravenous contrast. Multiplanar CT image reconstructions were also generated. COMPARISON:  CT 07/06/2019 FINDINGS: Alignment: No subluxation. Trace anterolisthesis C4 on C5 without change. Facet alignment is maintained. Skull base and vertebrae: No acute fracture. No primary bone lesion or focal pathologic process. Soft tissues and spinal canal: No prevertebral fluid or swelling. No visible canal hematoma. Disc levels: Diffuse degenerative changes throughout the cervical spine, moderate severe at C5-C6, C6-C7 and C7-T1. Facet degenerative changes at multiple levels with foraminal stenosis at multiple levels. Upper chest: Apical pleural and parenchymal scarring with calcification Other: None IMPRESSION: Degenerative changes.  No acute osseous abnormality. Electronically Signed   By: Victor Jones M.D.   On: 07/25/2019 20:46   DG Pelvis Portable  Result Date: 07/25/2019 CLINICAL DATA:  Failure to thrive, fall EXAM: PORTABLE PELVIS 1-2 VIEWS COMPARISON:  07/06/2019 FINDINGS: SI joints are non widened. Pubic symphysis and rami appear intact. Left hip prosthesis without dislocation. Lucency about the acetabular component. No acute fracture. Limited evaluation of the right femoral neck secondary to obscuration by the trochanter. Advanced arthritis of the right hip with effaced joint space, sclerosis and subchondral cyst. IMPRESSION: 1. Left hip replacement without fracture or malalignment 2. Advanced arthritis right hip. Limited evaluation of the right hip for acute osseous trauma secondary to positioning Electronically Signed   By:  Victor Jones M.D.   On: 07/25/2019 20:49     Labs:   Basic Metabolic Panel: Recent Labs  Lab 07/25/19 2027 07/25/19 2027 07/26/19 0452 07/27/19 0559  NA 134*  --  138 139  K 4.8   < > 4.3 3.8  CL 103  --  107 107  CO2 23  --  22 25  GLUCOSE 118*  --  102* 93  BUN 30*  --  30* 28*  CREATININE 0.88  --  0.84 0.65  CALCIUM 9.1  --  8.8* 8.9  MG 2.0  --   --   --    < > = values in this interval not displayed.   GFR Estimated Creatinine Clearance: 67.7 mL/min (by C-G formula based on SCr of 0.65 mg/dL). Liver Function Tests: No results for input(s): AST, ALT, ALKPHOS, BILITOT, PROT, ALBUMIN in the last 168 hours. No results for input(s): LIPASE, AMYLASE in the last 168 hours. No results for input(s): AMMONIA in the last 168 hours. Coagulation profile No results for input(s): INR, PROTIME in the last 168 hours.  CBC: Recent Labs  Lab 07/25/19 2027 07/26/19 0452 07/27/19 0559  WBC 16.8* 13.6* 9.6  NEUTROABS 10.2*  --  6.0  HGB 11.7* 10.8* 11.3*  HCT 35.2* 31.0* 33.7*  MCV 95.9 92.8 94.7  PLT 248 230 247   Cardiac Enzymes: No results for input(s): CKTOTAL, CKMB, CKMBINDEX, TROPONINI in the last 168 hours. BNP: Invalid input(s): POCBNP CBG: No results  for input(s): GLUCAP in the last 168 hours. D-Dimer No results for input(s): DDIMER in the last 72 hours. Hgb A1c No results for input(s): HGBA1C in the last 72 hours. Lipid Profile No results for input(s): CHOL, HDL, LDLCALC, TRIG, CHOLHDL, LDLDIRECT in the last 72 hours. Thyroid function studies No results for input(s): TSH, T4TOTAL, T3FREE, THYROIDAB in the last 72 hours.  Invalid input(s): FREET3 Anemia work up No results for input(s): VITAMINB12, FOLATE, FERRITIN, TIBC, IRON, RETICCTPCT in the last 72 hours. Microbiology Recent Results (from the past 240 hour(s))  Respiratory Panel by RT PCR (Flu A&B, Covid) - Nasopharyngeal Swab     Status: None   Collection Time: 07/25/19  8:27 PM   Specimen:  Nasopharyngeal Swab  Result Value Ref Range Status   SARS Coronavirus 2 by RT PCR NEGATIVE NEGATIVE Final    Comment: (NOTE) SARS-CoV-2 target nucleic acids are NOT DETECTED. The SARS-CoV-2 RNA is generally detectable in upper respiratoy specimens during the acute phase of infection. The lowest concentration of SARS-CoV-2 viral copies this assay can detect is 131 copies/mL. A negative result does not preclude SARS-Cov-2 infection and should not be used as the sole basis for treatment or other patient management decisions. A negative result may occur with  improper specimen collection/handling, submission of specimen other than nasopharyngeal swab, presence of viral mutation(s) within the areas targeted by this assay, and inadequate number of viral copies (<131 copies/mL). A negative result must be combined with clinical observations, patient history, and epidemiological information. The expected result is Negative. Fact Sheet for Patients:  PinkCheek.be Fact Sheet for Healthcare Providers:  GravelBags.it This test is not yet ap proved or cleared by the [place] FDA and  has been authorized for detection and/or diagnosis of SARS-CoV-2 by FDA under an Emergency Use Authorization (EUA). This EUA will remain  in effect (meaning this test can be used) for the duration of the COVID-19 declaration under Section 564(b)(1) of the Act, 21 U.S.C. section 360bbb-3(b)(1), unless the authorization is terminated or revoked sooner.    Influenza A by PCR NEGATIVE NEGATIVE Final   Influenza B by PCR NEGATIVE NEGATIVE Final    Comment: (NOTE) The Xpert Xpress SARS-CoV-2/FLU/RSV assay is intended as an aid in  the diagnosis of influenza from Nasopharyngeal swab specimens and  should not be used as a sole basis for treatment. Nasal washings and  aspirates are unacceptable for Xpert Xpress SARS-CoV-2/FLU/RSV  testing. Fact Sheet for  Patients: PinkCheek.be Fact Sheet for Healthcare Providers: GravelBags.it This test is not yet approved or cleared by the [place] FDA and  has been authorized for detection and/or diagnosis of SARS-CoV-2 by  FDA under an Emergency Use Authorization (EUA). This EUA will remain  in effect (meaning this test can be used) for the duration of the  Covid-19 declaration under Section 564(b)(1) of the Act, 21  U.S.C. section 360bbb-3(b)(1), unless the authorization is  terminated or revoked. Performed at [facility], [address] [place], [place] [address]   Urine Culture     Status: Abnormal   Collection Time: 07/25/19  8:27 PM   Specimen: Urine, Random  Result Value Ref Range Status   Specimen Description URINE, RANDOM  Final   Special Requests   Final    NONE Performed at [facility], [address] [place], [place] [address]    Culture (A)  Final    >=100,000 COLONIES/mL PSEUDOMONAS AERUGINOSA >=100,000 COLONIES/mL ENTEROCOCCUS FAECALIS    Report Status 07/28/2019 FINAL  Final   Organism ID, Bacteria ENTEROCOCCUS FAECALIS (A)  Final   Organism ID, Bacteria PSEUDOMONAS AERUGINOSA (A)  Final      Susceptibility   Enterococcus faecalis - MIC*    AMPICILLIN <=2 SENSITIVE Sensitive     NITROFURANTOIN <=16 SENSITIVE Sensitive     VANCOMYCIN 1 SENSITIVE Sensitive     * >=100,000 COLONIES/mL ENTEROCOCCUS FAECALIS   Pseudomonas aeruginosa - MIC*    CEFTAZIDIME 2 SENSITIVE Sensitive     CIPROFLOXACIN <=0.25 SENSITIVE Sensitive     GENTAMICIN <=1 SENSITIVE Sensitive     IMIPENEM 2 SENSITIVE Sensitive     PIP/TAZO 8 SENSITIVE Sensitive     CEFEPIME 2 SENSITIVE Sensitive     * >=100,000 COLONIES/mL PSEUDOMONAS AERUGINOSA     Discharge Instructions:   Discharge Instructions    Diet - low sodium heart healthy   Complete by: As directed    Increase activity slowly   Complete by: As directed        Allergies as of 07/30/2019   No Known Allergies     Medication List    TAKE these medications   amoxicillin 500 MG capsule Commonly known as: AMOXIL Take 1 capsule (500 mg total) by mouth every 8 (eight) hours for 3 days.   aspirin EC 81 MG tablet Take 81 mg by mouth daily.   finasteride 5 MG tablet Commonly known as: Proscar Take 1 tablet (5 mg total) by mouth daily.   levofloxacin 500 MG tablet Commonly known as: LEVAQUIN Take 1 tablet (500 mg total) by mouth daily for 2 days. Start taking on: July 31, 2019   Multi-Vitamin tablet Take 1 tablet by mouth daily.   simvastatin 20 MG tablet Commonly known as: ZOCOR Take 20 mg by mouth at bedtime.   tamsulosin 0.4 MG Caps capsule Commonly known as: FLOMAX Take 1 capsule (0.4 mg total) by mouth daily.   torsemide 10 MG tablet Commonly known as: DEMADEX Take 10 mg by mouth daily.       Contact information for follow-up providers    [facility]. Schedule an appointment as soon as possible for a visit in 1 week(s).   Specialty: Urology Contact information: [address] [place] [address] [phone]           Contact information for after-discharge care    Destination    [facility] .   Service: Skilled Nursing Contact information: [address] [place] [address] [phone]                   Time coordinating discharge: 32 minutes  Signed:  Lurene Shadow  [facility] 07/30/2019, 12:10 PM

## 2019-07-30 NOTE — TOC Progression Note (Addendum)
Transition of Care [facility]) - Progression Note    Patient Details  Name: Victor Jones MRN: [id number] Date of Birth: 01-11-1929  [facility] CM/SW Contact  Margarito Liner, LCSW Phone Number: 07/30/2019, 9:06 AM  Clinical Narrative: Medical director for [facility], Dr. Logan Bores, confirmed that he approved patient's insurance authorization last night. CSW called [employer] this morning. Authorization number has not been generated yet but they will call shortly once it has been completed.   9:59 am: Patient has insurance approval to discharge to [facility] today: [phone]. Sent secure chat to MD to notify. Called daughter to notify.  Expected Discharge Plan: Skilled Nursing Facility Barriers to Discharge: Continued Medical Work up  Expected Discharge Plan and Services Expected Discharge Plan: Skilled Nursing Facility   Discharge Planning Services: CM Consult   Living arrangements for the past 2 months: Apartment                                       Social Determinants of Health (SDOH) Interventions    Readmission Risk Interventions No flowsheet data found.

## 2019-07-30 NOTE — Progress Notes (Signed)
Victor Jones and Victor Jones x0-1. VSS. Pt tolerating diet well. No complaints of nausea or vomiting. IV removed intact, prescriptions given. Patient discharged via stretcher with EMS  Allergies as of 07/30/2019   No Known Allergies     Medication List    TAKE these medications   amoxicillin 500 MG capsule Commonly known as: AMOXIL Take 1 capsule (500 mg total) by mouth every 8 (eight) hours for 3 days.   aspirin EC 81 MG tablet Take 81 mg by mouth daily.   finasteride 5 MG tablet Commonly known as: Proscar Take 1 tablet (5 mg total) by mouth daily.   levofloxacin 500 MG tablet Commonly known as: LEVAQUIN Take 1 tablet (500 mg total) by mouth daily for 2 days. Start taking on: July 31, 2019   Multi-Vitamin tablet Take 1 tablet by mouth daily.   simvastatin 20 MG tablet Commonly known as: ZOCOR Take 20 mg by mouth at bedtime.   tamsulosin 0.4 MG Caps capsule Commonly known as: FLOMAX Take 1 capsule (0.4 mg total) by mouth daily.   torsemide 10 MG tablet Commonly known as: DEMADEX Take 10 mg by mouth daily.       Vitals:   07/30/19 0543 07/30/19 1303  BP: 103/75 115/65  Pulse: 90 84  Resp: 18 18  Temp: 97.9 F (36.6 C) (!) 97.5 F (36.4 C)  SpO2: 99% 100%    Victor Jones

## 2019-07-30 NOTE — TOC Transition Note (Signed)
Transition of Care [facility]) - CM/SW Discharge Note   Patient Details  Name: Victor Jones MRN: [id number] Date of Birth: 1928/09/03  [facility]) CM/SW Contact:  Margarito Liner, LCSW Phone Number: 07/30/2019, 1:07 PM   Clinical Narrative:  Patient has orders to discharge to [facility] today. Nurse will call report to [phone] (Room 314A). EMS has been called. Patient is 4th on the list. Nurse will call daughter when they arrive to pick him up. No further concerns. CSW signing off.   Final next level of care: Skilled Nursing Facility Barriers to Discharge: Barriers Resolved   Patient Goals and CMS Choice Patient states their goals for this hospitalization and ongoing recovery are:: Seeking SNF placement   Choice offered to / list presented to : Adult Children, Spouse  Discharge Placement PASRR number recieved: 07/29/19            Patient chooses bed at: [facility] Patient to be transferred to facility by: EMS Name of family member notified: Zigmund Daniel Patient and family notified of of transfer: 07/30/19  Discharge Plan and Services   Discharge Planning Services: CM Consult                                 Social Determinants of Health (SDOH) Interventions     Readmission Risk Interventions No flowsheet data found.

## 2019-08-08 ENCOUNTER — Ambulatory Visit: Payer: PPO | Admitting: Physician Assistant

## 2019-08-13 ENCOUNTER — Encounter: Payer: Self-pay | Admitting: Physician Assistant

## 2019-08-13 ENCOUNTER — Ambulatory Visit (INDEPENDENT_AMBULATORY_CARE_PROVIDER_SITE_OTHER): Payer: PPO | Admitting: Physician Assistant

## 2019-08-13 ENCOUNTER — Other Ambulatory Visit: Payer: Self-pay

## 2019-08-13 VITALS — BP 88/55 | HR 65 | Ht 67.0 in | Wt 165.0 lb

## 2019-08-13 DIAGNOSIS — R339 Retention of urine, unspecified: Secondary | ICD-10-CM | POA: Diagnosis not present

## 2019-08-13 NOTE — Patient Instructions (Addendum)
Stop Flomax; continue finasteride. We will plan for monthly catheter changes moving forward. This can be done in our clinic or by his nursing staff.  Indwelling Urinary Catheter Care, Adult An indwelling urinary catheter is a thin, flexible, germ-free (sterile) tube that is placed into the bladder to help drain urine out of the body. The catheter is inserted into the part of the body that drains urine from the bladder (urethra). Urine drains from the catheter into a drainage bag outside of the body. Taking good care of your catheter will keep it working properly and help to prevent problems from developing. What are the risks?  Bacteria may get into your bladder and cause a urinary tract infection.  Urine flow can become blocked. This can happen if the catheter is not working correctly, or if you have sediment or a blood clot in your bladder or the catheter.  Tissue near the catheter may become irritated and bleed. How to wear your catheter and your drainage bag Supplies needed  Adhesive tape or a leg strap.  Alcohol wipe or soap and water (if you use tape).  A clean towel (if you use tape).  Overnight drainage bag.  Smaller drainage bag (leg bag). Wearing your catheter and bag Use adhesive tape or a leg strap to attach your catheter to your leg.  Make sure the catheter is not pulled tight.  If a leg strap gets wet, replace it with a dry one.  If you use adhesive tape: 1. Use an alcohol wipe or soap and water to wash off any stickiness on your skin where you had tape before. 2. Use a clean towel to pat-dry the area. 3. Apply the new tape. You should have received a large overnight drainage bag and a smaller leg bag that fits underneath clothing.  You may wear the overnight bag at any time, but you should not wear the leg bag at night.  Always wear the leg bag below your knee.  Make sure the overnight drainage bag is always lower than the level of your bladder, but do not let  it touch the floor. Before you go to sleep, hang the bag inside a wastebasket that is covered by a clean plastic bag. How to care for your skin around the catheter     Supplies needed  A clean washcloth.  Water and mild soap.  A clean towel. Caring for your skin and catheter  Every day, use a clean washcloth and soapy water to clean the skin around your catheter. 1. Wash your hands with soap and water. 2. Wet a washcloth in warm water and mild soap. 3. Clean the skin around your urethra.  If you are male:  Use one hand to gently spread the folds of skin around your vagina (labia).  With the washcloth in your other hand, wipe the inner side of your labia on each side. Do this in a front-to-back direction.  If you are male:  Use one hand to pull back any skin that covers the end of your penis (foreskin).  With the washcloth in your other hand, wipe your penis in small circles. Start wiping at the tip of your penis, then move outward from the catheter.  Move the foreskin back in place, if this applies. 4. With your free hand, hold the catheter close to where it enters your body. Keep holding the catheter during cleaning so it does not get pulled out. 5. Use your other hand to clean the catheter  with the washcloth.  Only wipe downward on the catheter.  Do not wipe upward toward your body, because that may push bacteria into your urethra and cause infection. 6. Use a clean towel to pat-dry the catheter and the skin around it. Make sure to wipe off all soap. 7. Wash your hands with soap and water.  Shower every day. Do not take baths.  Do not use cream, ointment, or lotion on the area where the catheter enters your body, unless your health care provider tells you to do that.  Do not use powders, sprays, or lotions on your genital area.  Check your skin around the catheter every day for signs of infection. Check for: ? Redness, swelling, or pain. ? Fluid or blood. ?  Warmth. ? Pus or a bad smell. How to empty the drainage bag Supplies needed  Rubbing alcohol.  Gauze pad or cotton ball.  Adhesive tape or a leg strap. Emptying the bag Empty your drainage bag (your overnight drainage bag or your leg bag) when it is ?- full, or at least 2-3 times a day. Clean the drainage bag according to the manufacturer's instructions or as told by your health care provider. 1. Wash your hands with soap and water. 2. Detach the drainage bag from your leg. 3. Hold the drainage bag over the toilet or a clean container. Make sure the drainage bag is lower than your hips and bladder. This stops urine from going back into the tubing and into your bladder. 4. Open the pour spout at the bottom of the bag. 5. Empty the urine into the toilet or container. Do not let the pour spout touch any surface. This precaution is important to prevent bacteria from getting in the bag and causing infection. 6. Apply rubbing alcohol to a gauze pad or cotton ball. 7. Use the gauze pad or cotton ball to clean the pour spout. 8. Close the pour spout. 9. Attach the bag to your leg with adhesive tape or a leg strap. 10. Wash your hands with soap and water. How to change the drainage bag Supplies needed:  Alcohol wipes.  A clean drainage bag.  Adhesive tape or a leg strap. Changing the bag Replace your drainage bag with a clean bag if it leaks, starts to smell bad, or looks dirty. 1. Wash your hands with soap and water. 2. Detach the dirty drainage bag from your leg. 3. Pinch the catheter with your fingers so that urine does not spill out. 4. Disconnect the catheter tube from the drainage tube at the connection valve. Do not let the tubes touch any surface. 5. Clean the end of the catheter tube with an alcohol wipe. Use a different alcohol wipe to clean the end of the drainage tube. 6. Connect the catheter tube to the drainage tube of the clean bag. 7. Attach the clean bag to your leg  with adhesive tape or a leg strap. Avoid attaching the new bag too tightly. 8. Wash your hands with soap and water. General instructions   Never pull on your catheter or try to remove it. Pulling can damage your internal tissues.  Always wash your hands before and after you handle your catheter or drainage bag. Use a mild, fragrance-free soap. If soap and water are not available, use hand sanitizer.  Always make sure there are no twists or bends (kinks) in the catheter tube.  Always make sure there are no leaks in the catheter or drainage bag.  Drink  enough fluid to keep your urine pale yellow.  Do not take baths, swim, or use a hot tub.  If you are male, wipe from front to back after having a bowel movement. Contact a health care provider if:  Your urine is cloudy.  Your urine smells unusually bad.  Your catheter gets clogged.  Your catheter starts to leak.  Your bladder feels full. Get help right away if:  You have redness, swelling, or pain where the catheter enters your body.  You have fluid, blood, pus, or a bad smell coming from the area where the catheter enters your body.  The area where the catheter enters your body feels warm to the touch.  You have a fever.  You have pain in your abdomen, legs, lower back, or bladder.  You see blood in the catheter.  Your urine is pink or red.  You have nausea, vomiting, or chills.  Your urine is not draining into the bag.  Your catheter gets pulled out. Summary  An indwelling urinary catheter is a thin, flexible, germ-free (sterile) tube that is placed into the bladder to help drain urine out of the body.  The catheter is inserted into the part of the body that drains urine from the bladder (urethra).  Take good care of your catheter to keep it working properly and help prevent problems from developing.  Always wash your hands before and after you handle your catheter or drainage bag.  Never pull on your  catheter or try to remove it. This information is not intended to replace advice given to you by your health care provider. Make sure you discuss any questions you have with your health care provider. Document Revised: 08/17/2018 Document Reviewed: 12/09/2016 Elsevier Patient Education  2020 [employer].

## 2019-08-13 NOTE — Progress Notes (Signed)
08/13/2019 9:48 AM   Victor Jones [birth year] [id number]  CC: Hospital follow-up  HPI: Victor Jones is a 86 y.o. male with dementia and a recent history of refractory urinary retention who presents today for hospital follow-up.  He presented to the ED on 07/25/2019 with reports of the inability to urinate following Foley catheter removal that morning in our office for a second outpatient voiding trial.  Foley catheter was replaced at that time.  In the interim, patient has been placed at [facility].  He is accompanied today by his daughter, who contributes to HPI.  Today, patient reports feeling well.  He states he is tolerating his catheter well.  No acute known issues.  PMH: Past Medical History:  Diagnosis Date  . Dementia [facility])     Surgical History: History reviewed. No pertinent surgical history.  Home Medications:  Allergies as of 08/13/2019   No Known Allergies     Medication List       Accurate as of August 13, 2019  9:48 AM. If you have any questions, ask your nurse or doctor.        STOP taking these medications   tamsulosin 0.4 MG Caps capsule Commonly known as: FLOMAX Stopped by: Carman Ching, PA-C     TAKE these medications   aspirin EC 81 MG tablet Take 81 mg by mouth daily.   finasteride 5 MG tablet Commonly known as: Proscar Take 1 tablet (5 mg total) by mouth daily.   Multi-Vitamin tablet Take 1 tablet by mouth daily.   simvastatin 20 MG tablet Commonly known as: ZOCOR Take 20 mg by mouth at bedtime.   torsemide 10 MG tablet Commonly known as: DEMADEX Take 10 mg by mouth daily.       Allergies:  No Known Allergies  Family History: History reviewed. No pertinent family history.  Social History:   reports that he has never smoked. He has never used smokeless tobacco. He reports previous alcohol use. No history on file for drug.  Physical Exam: BP (!) 88/55   Pulse 65   Ht 5\' 7"  (1.702 m)   Wt 165 lb (74.8 kg)    BMI 25.84 kg/m   Constitutional:  Alert and oriented, no acute distress, nontoxic appearing HEENT: [place], [place] Cardiovascular: No clubbing, cyanosis, or edema Respiratory: Normal respiratory effort, no increased work of breathing Skin: No rashes, bruises or suspicious lesions Neurologic: Grossly intact, no focal deficits, moving all 4 extremities Psychiatric: Normal mood and affect  Assessment & Plan:   1. Urinary retention I had a lengthy conversation with the patient's daughter today regarding her father's urinary prognosis.  I explained that he has now failed 2 outpatient voiding trials on Flomax and finasteride and I do not believe that he will spontaneously regain the ability to urinate at this point.  She expressed understanding.  I explained in the situations, we may consider evaluation for bladder outlet procedures.  Patient's daughter is concerned that her father is a poor surgical candidate and would like to defer this at this time.  I am in agreement with this plan.  Based on this, I recommended either [facility] teaching versus chronic indwelling Foley catheterization.  Patient's daughter is concerned about his ability to tolerate intermittent catheterization with staff.  I share these concerns.  We have agreed to proceed with chronic indwelling Foley catheterization at this time.  Daughter would like to defer catheter exchange today, as it has been less than 1 month since his last catheter  placement.  I am in agreement with this plan.  We will schedule him for follow-up in approximately 10 days for catheter exchange.  I do believe patient to be at high risk of traumatic Foley removal due to his dementia.  He has historically been quite redirectable when he shows interest in his Foley catheter.  I encouraged the patient's daughter to ensure that his Foley catheter tubing be affixed to his nondominant, right, side and to place a dummy catheter on his dominant, left side to reduce Foley pulling.  She  expressed understanding.  I counseled patient and his daughter that he may undergo monthly catheter exchanges in our clinic or at his nursing home.  Patient's daughter is interested in establishing the service at [facility] and will attempt to do so.  Otherwise, we will plan to see him in clinic in 10 days.  Return in about 10 days (around 08/23/2019) for Catheter exchange.  Debroah Loop, PA-C  [facility] [address] [place], [place] [address] [phone]

## 2019-08-22 ENCOUNTER — Encounter: Payer: Self-pay | Admitting: Physician Assistant

## 2019-08-22 ENCOUNTER — Other Ambulatory Visit: Payer: Self-pay

## 2019-08-22 ENCOUNTER — Ambulatory Visit (INDEPENDENT_AMBULATORY_CARE_PROVIDER_SITE_OTHER): Payer: PPO | Admitting: Physician Assistant

## 2019-08-22 VITALS — BP 108/72 | HR 69 | Ht 72.0 in | Wt 150.0 lb

## 2019-08-22 DIAGNOSIS — R339 Retention of urine, unspecified: Secondary | ICD-10-CM | POA: Diagnosis not present

## 2019-08-22 NOTE — Progress Notes (Signed)
Cath Change/ Replacement  Patient is present today for a catheter change due to urinary retention.  76ml of water was removed from the balloon, a 16FR Coude foley cath was removed with out difficulty.  Patient was cleaned and prepped in a sterile fashion with betadine. A 16 FR coude foley cath was replaced into the bladder no complications were noted. Urine return was noted 73ml and urine was yellow in color. The balloon was filled with 29ml of sterile water. A night bag was attached for drainage.  A night bag was also given to the patient and patient was given instruction on how to change from one bag to another. Patient was given proper instruction on catheter care.    Performed by: Franchot Erichsen, CMA  Follow up: 1 month

## 2019-08-23 DIAGNOSIS — F0390 Unspecified dementia without behavioral disturbance: Secondary | ICD-10-CM | POA: Diagnosis not present

## 2019-08-23 DIAGNOSIS — I2510 Atherosclerotic heart disease of native coronary artery without angina pectoris: Secondary | ICD-10-CM | POA: Diagnosis not present

## 2019-09-20 ENCOUNTER — Ambulatory Visit (INDEPENDENT_AMBULATORY_CARE_PROVIDER_SITE_OTHER): Payer: PPO | Admitting: Physician Assistant

## 2019-09-20 ENCOUNTER — Other Ambulatory Visit: Payer: Self-pay

## 2019-09-20 ENCOUNTER — Encounter: Payer: Self-pay | Admitting: Physician Assistant

## 2019-09-20 VITALS — BP 101/52 | HR 74

## 2019-09-20 DIAGNOSIS — R339 Retention of urine, unspecified: Secondary | ICD-10-CM | POA: Diagnosis not present

## 2019-09-20 NOTE — Progress Notes (Signed)
Cath Change/ Replacement  Patient is present today for a catheter change due to urinary retention.  73ml of water was removed from the balloon, a 16FR coude foley cath was removed without difficulty.  Patient's foreskin was retracted, he was cleaned and prepped in a sterile fashion with betadine, and 2% lidocaine jelly was instilled into the urethra. A 16 FR coud foley cath was replaced into the bladder no complications were noted Urine return was noted 27ml and urine was yellow in color. The balloon was filled with 72ml of sterile water and the foreskin was reduced. A night bag was attached for drainage and secured to his anterior right thigh with a StatLock.  Patient declined an additional night bag. Patient was given proper instruction on catheter care.    Performed by: Carman Ching, PA-C   Follow up: No follow-ups on file.

## 2019-10-21 ENCOUNTER — Ambulatory Visit: Payer: Self-pay | Admitting: Physician Assistant

## 2019-11-07 DEATH — deceased

## 2019-11-08 ENCOUNTER — Ambulatory Visit: Payer: Self-pay | Admitting: Physician Assistant

## 2022-02-08 IMAGING — CT CT HEAD W/O CM
4 series · 16 of 47 positions shown, 18 images · non-contrast
Comparison: None.

CLINICAL DATA: TIA. Altered mental status. Dementia. Patient
uncooperative.

EXAM:
CT HEAD WITHOUT CONTRAST
CT CERVICAL SPINE WITHOUT CONTRAST
TECHNIQUE: Multidetector CT imaging of the head and cervical spine was
performed following the standard protocol without intravenous
contrast. Multiplanar CT image reconstructions of the cervical spine
were also generated.

[Series 3: head wo · axial · 0.43mm/px · z∈[+1393,+1513]mm · 7 of 32 slices shown, 9 images]
[im 4/32  brain]
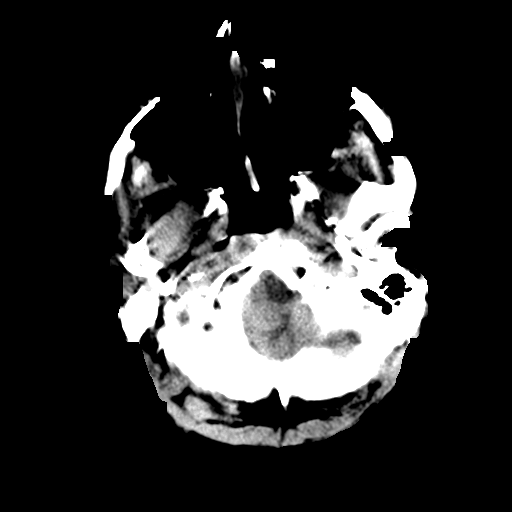
[im 4/32  bone]
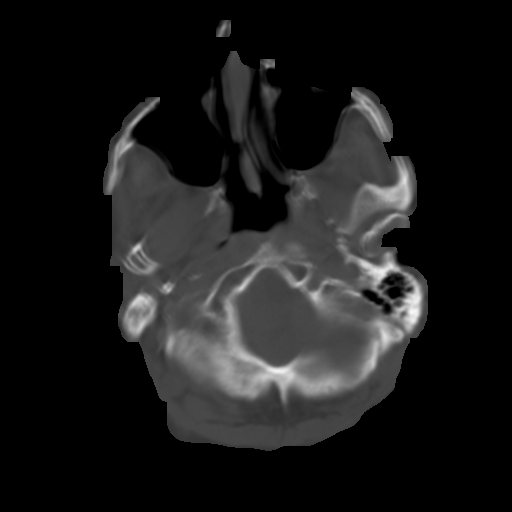
[im 8/32  brain]
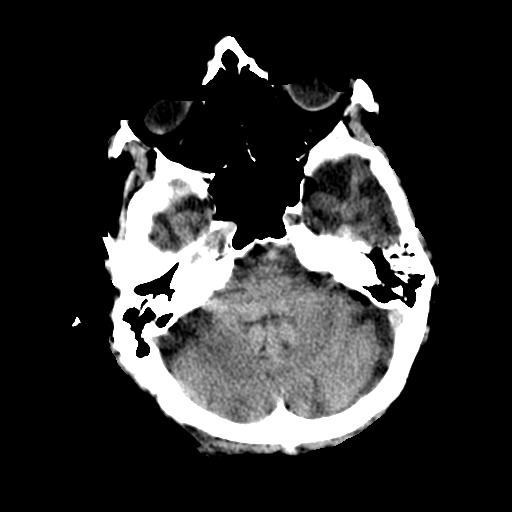
[im 12/32  brain]
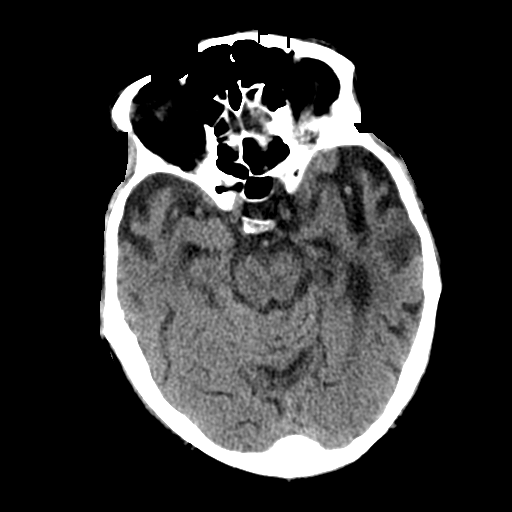
[im 16/32  brain]
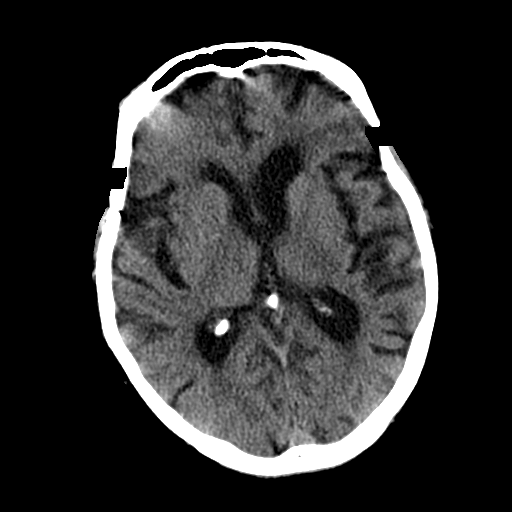
[im 20/32  brain]
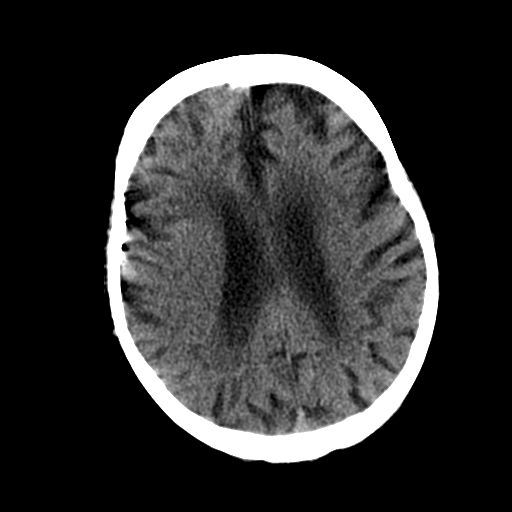
[im 20/32  bone]
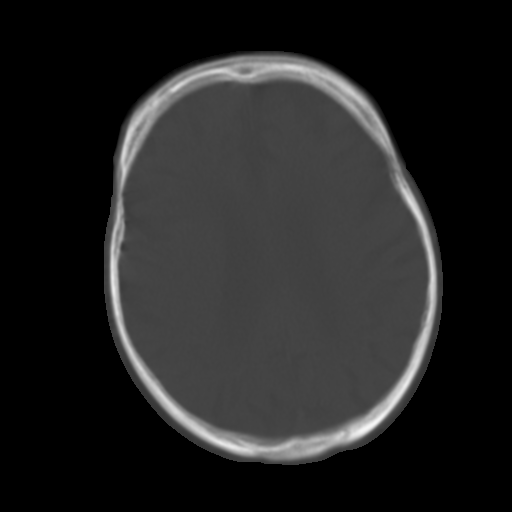
[im 24/32  brain]
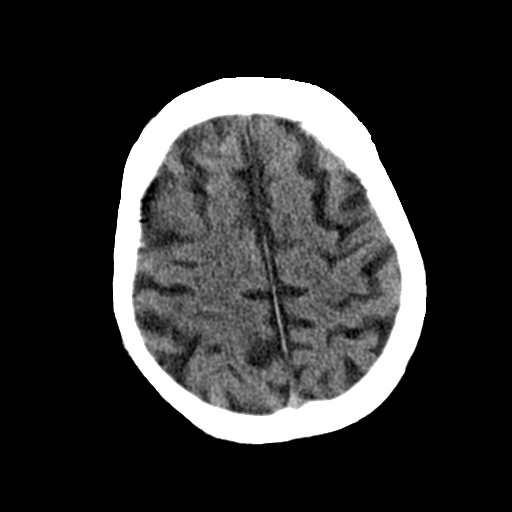
[im 28/32  brain]
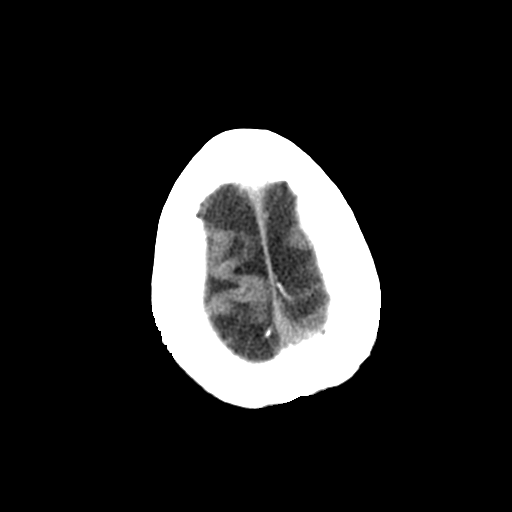

[Series 4: head bone · axial · 0.43mm/px · z∈[+1392,+1424]mm · 3 of 80 slices shown]
[im 8/80  bone]
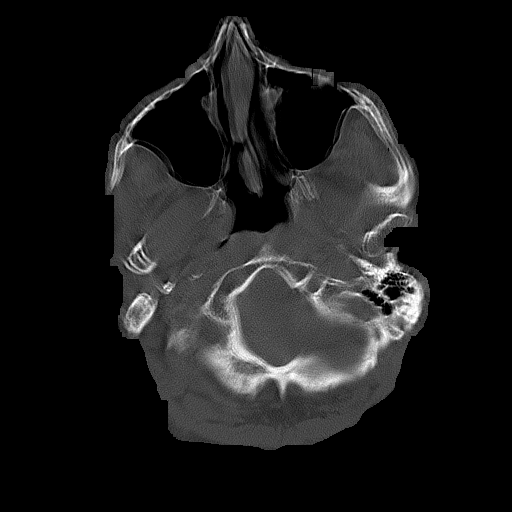
[im 16/80  bone]
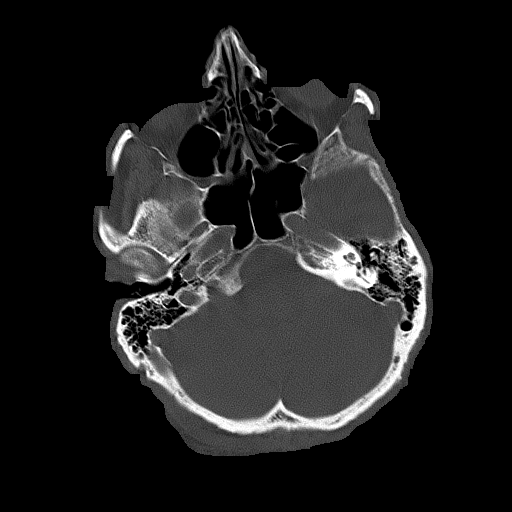
[im 24/80  bone]
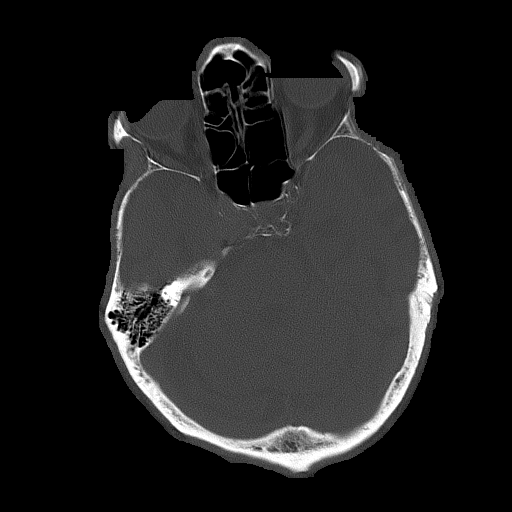

[Series 5: coronal soft tissue · coronal · 0.34mm/px · 3 of 64 slices shown]
[im 22/64  brain]
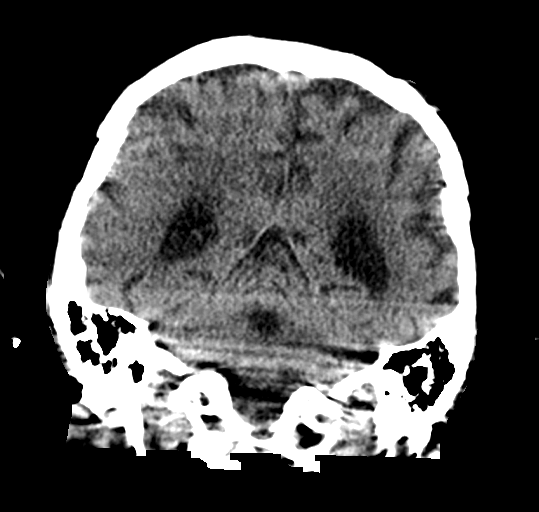
[im 29/64  brain]
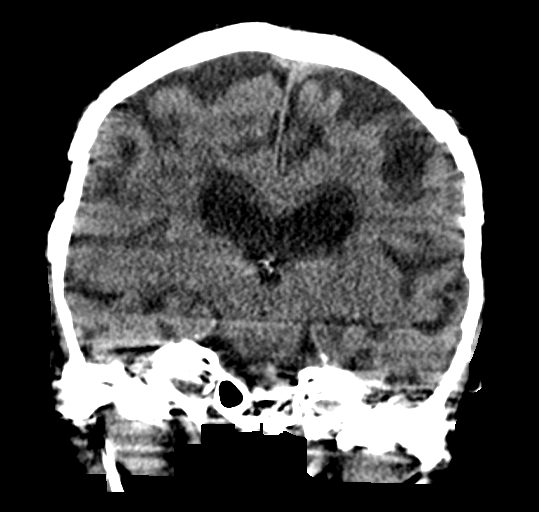
[im 36/64  brain]
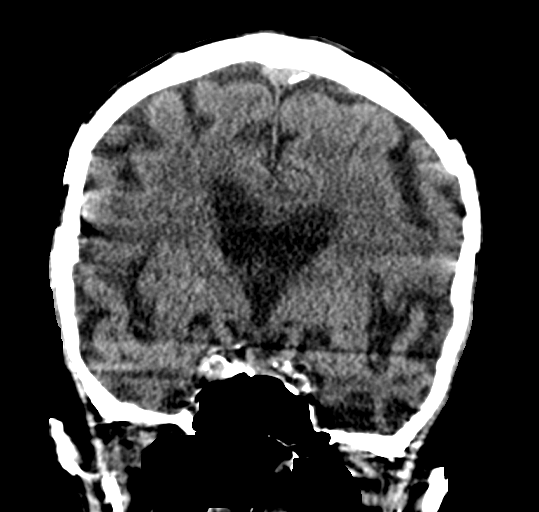

[Series 6: sagittal soft tissue · sagittal · 0.34mm/px · 3 of 51 slices shown]
[im 17/51  brain]
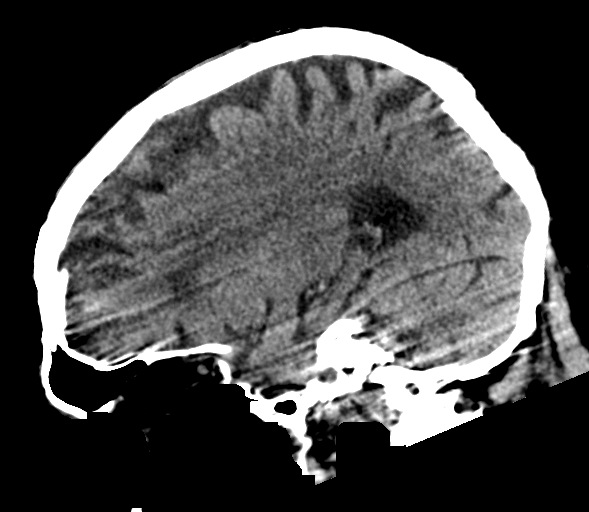
[im 26/51  brain]
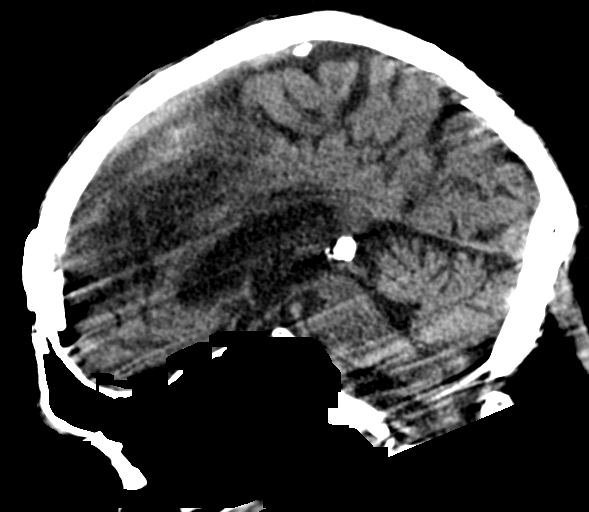
[im 34/51  brain]
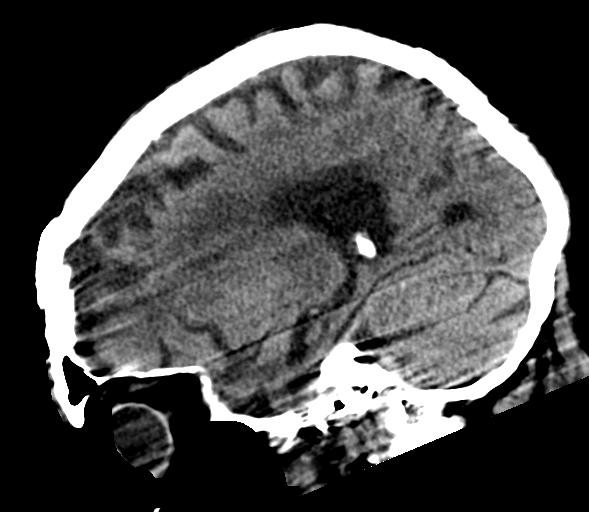

[16 of 47 positions shown; findings below may reference images not displayed]

FINDINGS: CT HEAD FINDINGS

Brain: Study suffers from motion degradation. There is generalized
brain atrophy. No sign of acute infarction, mass lesion, hemorrhage,
hydrocephalus or extra-axial collection.

Vascular: There is atherosclerotic calcification of the major
vessels at the base of the brain.

Skull: Negative

Sinuses/Orbits: Clear/normal

Other: None

CT CERVICAL SPINE FINDINGS

Motion degradation on this exam as well.

Alignment: No traumatic malalignment.

Skull base and vertebrae: No fracture or focal lesion.

Soft tissues and spinal canal: Negative

Disc levels: Chronic facet osteoarthritis at C2-3, C3-4, C4-5 and
C7-T1. Chronic degenerative spondylosis with disc space narrowing
throughout the cervical region. No apparent compressive canal
stenosis. Some osteophytic foraminal narrowing from C3-4 through
C7-T1, most pronounced at the C3-4 and C4-5 levels.

Upper chest: Pleural and parenchymal scarring.

Other: None
IMPRESSION: Study suffer from motion degradation. This limits sensitivity for
subtle findings.

Head CT: Generalized atrophy.  No sign of focal or acute finding.

Cervical spine CT: No acute or traumatic finding. Chronic
degenerative spondylosis and facet arthropathy. Foraminal narrowing
most pronounced at C3-4 and C4-5.

## 2022-02-08 IMAGING — CT CT PELVIS W/O CM
2 of 3 series · 16 of 46 positions shown, 18 images · non-contrast
Comparison: Pelvic radiograph dated 07/06/2019.

CLINICAL DATA: 89-year-old male with fall and hip pain.

EXAM:
CT PELVIS WITHOUT CONTRAST
TECHNIQUE: Multidetector CT imaging of the pelvis was performed following the
standard protocol without intravenous contrast.

[Series 6: axial soft (person_name) · axial · 0.86mm/px · z∈[+660,+910]mm · 13 of 97 slices shown, 15 images]
[im 7/97  soft-tissue]
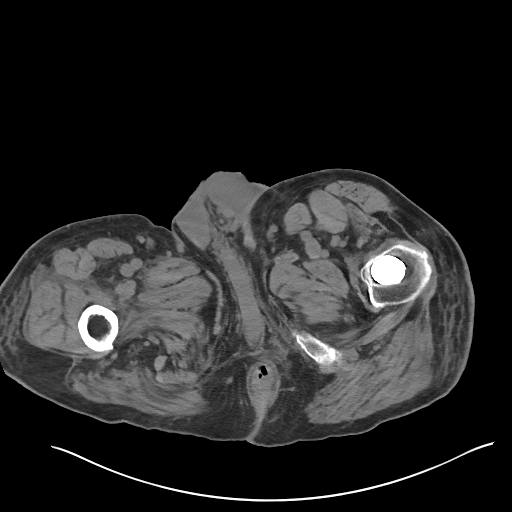
[im 7/97  bone]
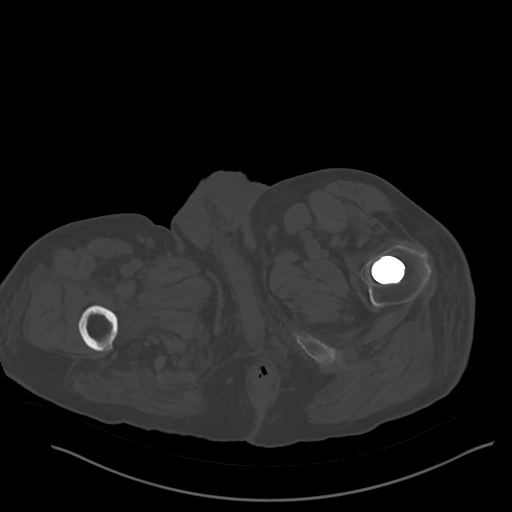
[im 13/97  soft-tissue]
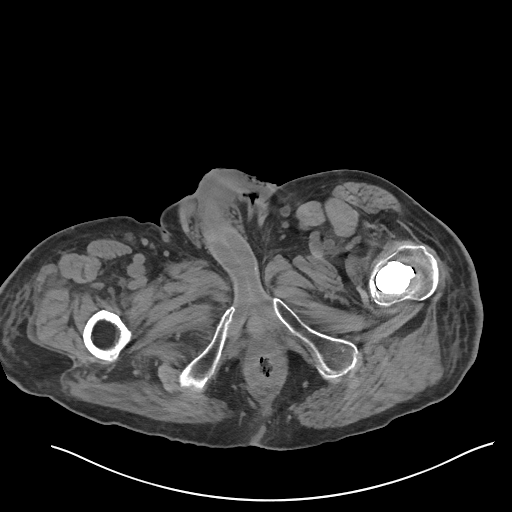
[im 19/97  soft-tissue]
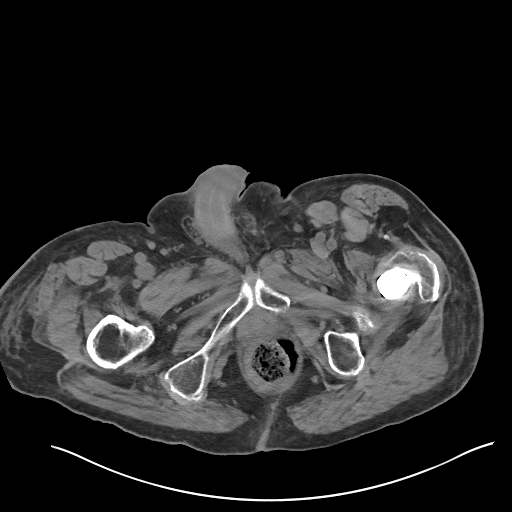
[im 28/97  soft-tissue]
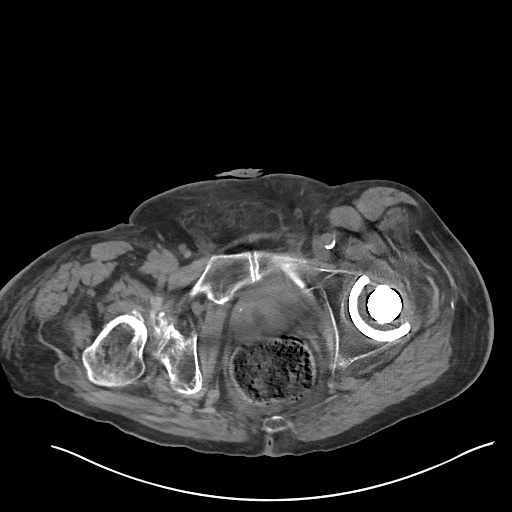
[im 35/97  soft-tissue]
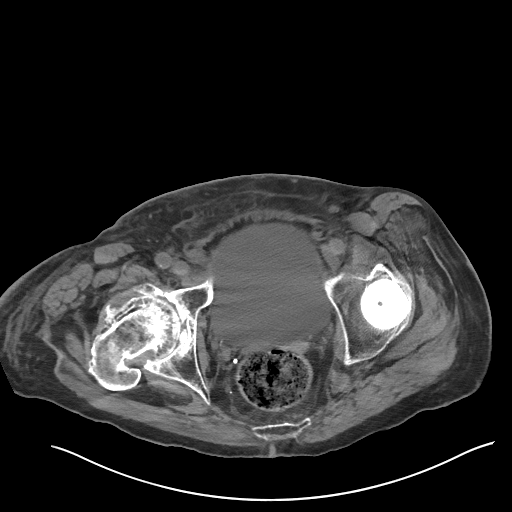
[im 41/97  soft-tissue]
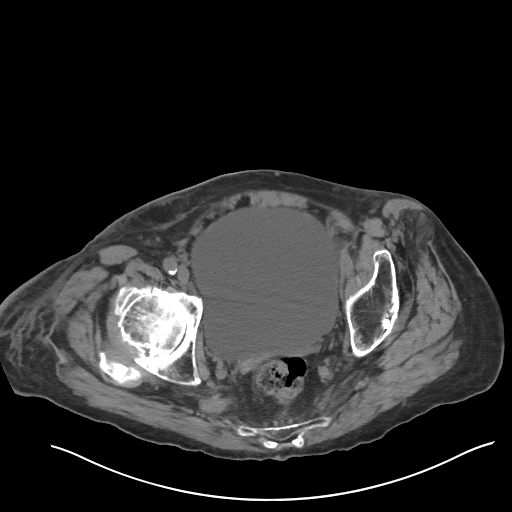
[im 50/97  soft-tissue]
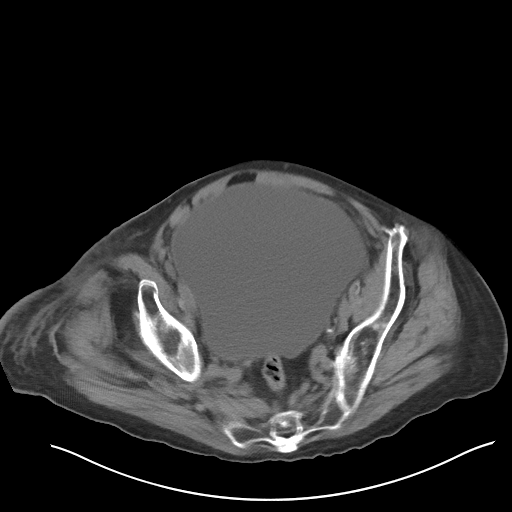
[im 56/97  soft-tissue]
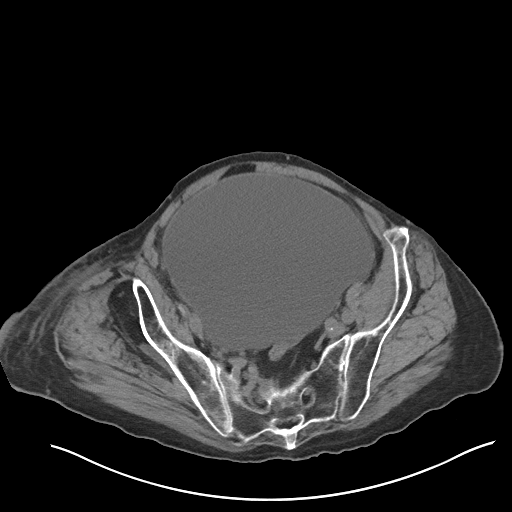
[im 62/97  soft-tissue]
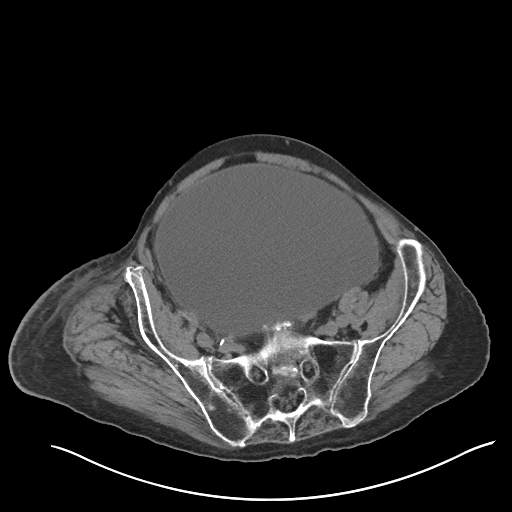
[im 62/97  bone]
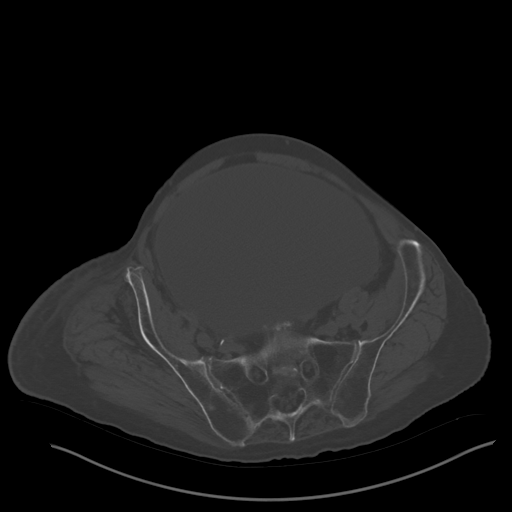
[im 69/97  soft-tissue]
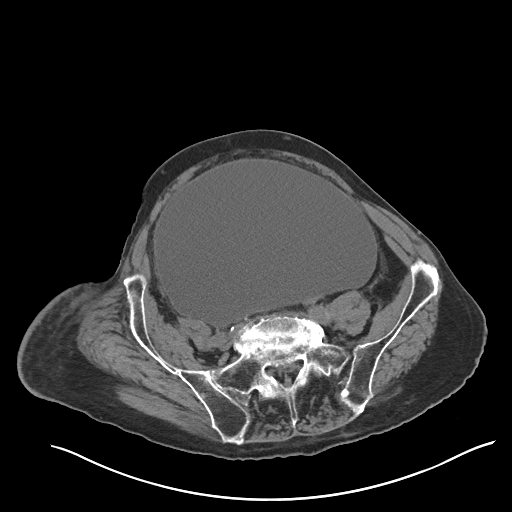
[im 78/97  soft-tissue]
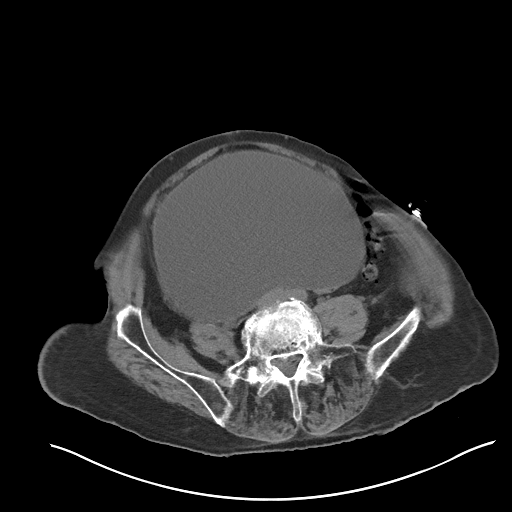
[im 84/97  soft-tissue]
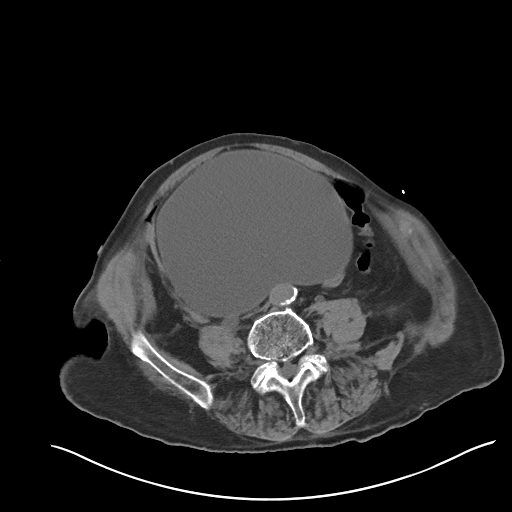
[im 90/97  soft-tissue]
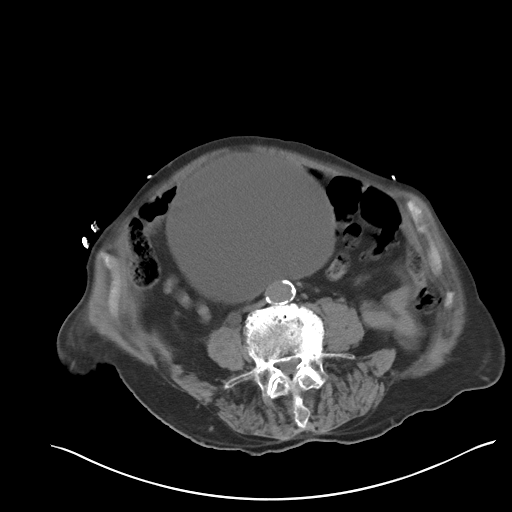

[Series 7: coronal soft · coronal · 0.60mm/px · 3 of 87 slices shown]
[im 29/87  soft-tissue]
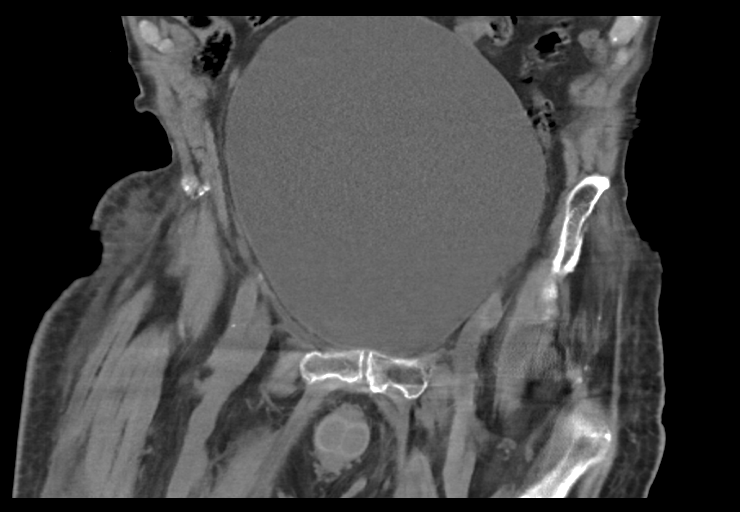
[im 39/87  soft-tissue]
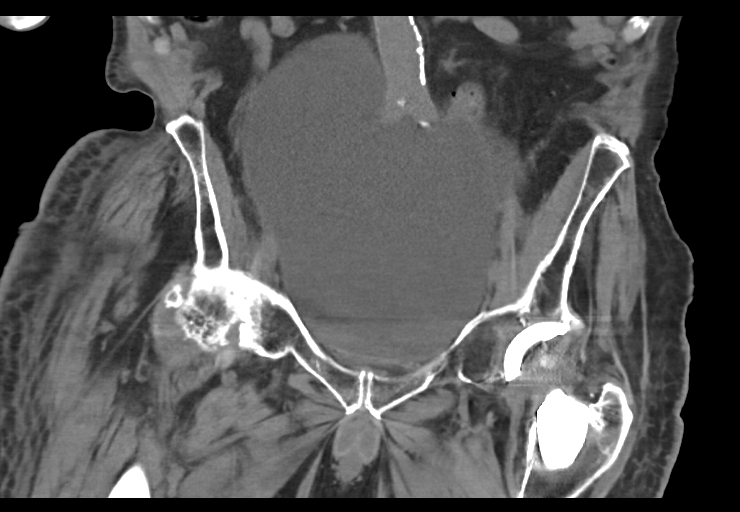
[im 48/87  soft-tissue]
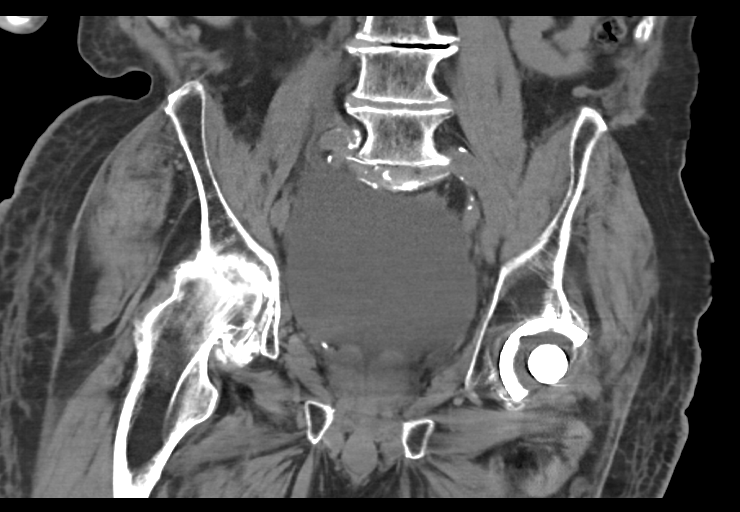

[16 of 46 positions shown; findings below may reference images not displayed]

FINDINGS: Evaluation is limited due to streak artifact caused by metallic left
hip arthroplasty.

Urinary Tract: Severely distended urinary bladder concerning for
bladder outlet obstruction. Clinical correlation is recommended.
Consider placement of the Foley catheter if the patient is unable to
void.

Bowel:  No bowel dilatation in the visualized pelvis.

Vascular/Lymphatic: Advanced aortoiliac atherosclerotic disease. No
adenopathy noted within the pelvis.

Reproductive: Mildly enlarged prostate gland measuring 4.7 cm in
transverse axial diameter. Apparent post procedural changes of TURP.

Other:  None

Musculoskeletal: Evaluation for fracture is limited due to advanced
osteopenia. No acute fracture or dislocation. Severe arthritic
changes of the right hip with near complete joint space loss and
bone-on-bone contact.
IMPRESSION: 1. No acute fracture or dislocation.
2. Osteopenia and severe osteoarthritic changes of the right hip.
3. Severely distended urinary bladder concerning for bladder outlet
obstruction. Clinical correlation is recommended.
4. Aortic Atherosclerosis ([id number].O).
